# Patient Record
Sex: Male | Born: 1965
Health system: Southern US, Community
[De-identification: ages and names within clinical notes are randomized; demographics above are authoritative.]

## PROBLEM LIST (undated history)

## (undated) DIAGNOSIS — M47812 Spondylosis without myelopathy or radiculopathy, cervical region: Secondary | ICD-10-CM

## (undated) DIAGNOSIS — R0902 Hypoxemia: Secondary | ICD-10-CM

## (undated) DIAGNOSIS — N289 Disorder of kidney and ureter, unspecified: Secondary | ICD-10-CM

## (undated) DIAGNOSIS — I509 Heart failure, unspecified: Secondary | ICD-10-CM

## (undated) DIAGNOSIS — I38 Endocarditis, valve unspecified: Secondary | ICD-10-CM

## (undated) DIAGNOSIS — K2901 Acute gastritis with bleeding: Secondary | ICD-10-CM

## (undated) DIAGNOSIS — I10 Essential (primary) hypertension: Secondary | ICD-10-CM

## (undated) DIAGNOSIS — T7840XA Allergy, unspecified, initial encounter: Secondary | ICD-10-CM

## (undated) DIAGNOSIS — E781 Pure hyperglyceridemia: Secondary | ICD-10-CM

## (undated) DIAGNOSIS — K449 Diaphragmatic hernia without obstruction or gangrene: Secondary | ICD-10-CM

## (undated) DIAGNOSIS — G43009 Migraine without aura, not intractable, without status migrainosus: Secondary | ICD-10-CM

## (undated) DIAGNOSIS — F32A Depression, unspecified: Secondary | ICD-10-CM

## (undated) DIAGNOSIS — N189 Chronic kidney disease, unspecified: Secondary | ICD-10-CM

## (undated) DIAGNOSIS — M545 Low back pain, unspecified: Secondary | ICD-10-CM

## (undated) DIAGNOSIS — I4891 Unspecified atrial fibrillation: Secondary | ICD-10-CM

## (undated) DIAGNOSIS — F329 Major depressive disorder, single episode, unspecified: Secondary | ICD-10-CM

## (undated) DIAGNOSIS — G4730 Sleep apnea, unspecified: Secondary | ICD-10-CM

## (undated) DIAGNOSIS — M109 Gout, unspecified: Secondary | ICD-10-CM

## (undated) DIAGNOSIS — I517 Cardiomegaly: Secondary | ICD-10-CM

## (undated) DIAGNOSIS — N529 Male erectile dysfunction, unspecified: Secondary | ICD-10-CM

## (undated) HISTORY — DX: Migraine without aura, not intractable, without status migrainosus: G43.009

## (undated) HISTORY — PX: COLONOSCOPY: SHX174

## (undated) HISTORY — DX: Acute gastritis with bleeding: K29.01

## (undated) HISTORY — DX: Low back pain: M54.5

## (undated) HISTORY — DX: Hypoxemia: R09.02

## (undated) HISTORY — PX: UPPER GASTROINTESTINAL ENDOSCOPY: SHX188

## (undated) HISTORY — DX: Depression, unspecified: F32.A

## (undated) HISTORY — DX: Low back pain, unspecified: M54.50

## (undated) HISTORY — DX: Cardiomegaly: I51.7

## (undated) HISTORY — DX: Unspecified atrial fibrillation: I48.91

## (undated) HISTORY — DX: Endocarditis, valve unspecified: I38

## (undated) HISTORY — DX: Allergy, unspecified, initial encounter: T78.40XA

## (undated) HISTORY — DX: Essential (primary) hypertension: I10

## (undated) HISTORY — DX: Spondylosis without myelopathy or radiculopathy, cervical region: M47.812

## (undated) HISTORY — DX: Gout, unspecified: M10.9

## (undated) HISTORY — DX: Pure hyperglyceridemia: E78.1

## (undated) HISTORY — DX: Diaphragmatic hernia without obstruction or gangrene: K44.9

## (undated) HISTORY — DX: Sleep apnea, unspecified: G47.30

## (undated) HISTORY — DX: Male erectile dysfunction, unspecified: N52.9

## (undated) HISTORY — DX: Chronic kidney disease, unspecified: N18.9

## (undated) HISTORY — DX: Heart failure, unspecified: I50.9

## (undated) HISTORY — DX: Major depressive disorder, single episode, unspecified: F32.9

---

## 1992-08-03 HISTORY — PX: CARPAL TUNNEL RELEASE: SHX101

## 1997-08-03 HISTORY — PX: BACK SURGERY: SHX140

## 1997-12-21 ENCOUNTER — Ambulatory Visit (HOSPITAL_COMMUNITY): Admission: RE | Admit: 1997-12-21 | Discharge: 1997-12-21 | Payer: Self-pay | Admitting: Neurosurgery

## 1999-12-17 ENCOUNTER — Encounter: Payer: Self-pay | Admitting: *Deleted

## 1999-12-18 ENCOUNTER — Ambulatory Visit (HOSPITAL_BASED_OUTPATIENT_CLINIC_OR_DEPARTMENT_OTHER): Admission: RE | Admit: 1999-12-18 | Discharge: 1999-12-18 | Payer: Self-pay | Admitting: *Deleted

## 2001-08-03 HISTORY — PX: ROTATOR CUFF REPAIR: SHX139

## 2003-01-19 ENCOUNTER — Ambulatory Visit (HOSPITAL_BASED_OUTPATIENT_CLINIC_OR_DEPARTMENT_OTHER): Admission: RE | Admit: 2003-01-19 | Discharge: 2003-01-19 | Payer: Self-pay

## 2005-07-28 ENCOUNTER — Encounter: Admission: RE | Admit: 2005-07-28 | Discharge: 2005-07-28 | Payer: Self-pay | Admitting: Neurosurgery

## 2005-08-14 ENCOUNTER — Encounter: Admission: RE | Admit: 2005-08-14 | Discharge: 2005-08-14 | Payer: Self-pay | Admitting: Neurosurgery

## 2007-03-08 ENCOUNTER — Emergency Department: Payer: Self-pay | Admitting: Emergency Medicine

## 2007-03-08 ENCOUNTER — Other Ambulatory Visit: Payer: Self-pay

## 2009-05-06 ENCOUNTER — Observation Stay: Payer: Self-pay | Admitting: *Deleted

## 2009-05-09 ENCOUNTER — Emergency Department: Payer: Self-pay

## 2009-05-09 ENCOUNTER — Inpatient Hospital Stay: Payer: Self-pay | Admitting: Gastroenterology

## 2009-05-09 ENCOUNTER — Ambulatory Visit: Payer: Self-pay | Admitting: Gastroenterology

## 2009-05-28 ENCOUNTER — Ambulatory Visit: Payer: Self-pay | Admitting: Gastroenterology

## 2009-10-15 ENCOUNTER — Ambulatory Visit: Payer: Self-pay | Admitting: Physician Assistant

## 2010-08-03 HISTORY — PX: NASAL SINUS SURGERY: SHX719

## 2010-09-23 ENCOUNTER — Encounter: Payer: Self-pay | Admitting: Cardiovascular Disease

## 2010-09-23 ENCOUNTER — Emergency Department: Payer: Self-pay | Admitting: Emergency Medicine

## 2010-09-24 DIAGNOSIS — K2940 Chronic atrophic gastritis without bleeding: Secondary | ICD-10-CM | POA: Insufficient documentation

## 2010-09-24 DIAGNOSIS — R079 Chest pain, unspecified: Secondary | ICD-10-CM | POA: Insufficient documentation

## 2010-09-24 DIAGNOSIS — M545 Low back pain: Secondary | ICD-10-CM | POA: Insufficient documentation

## 2010-09-24 DIAGNOSIS — R0602 Shortness of breath: Secondary | ICD-10-CM | POA: Insufficient documentation

## 2010-09-24 DIAGNOSIS — N529 Male erectile dysfunction, unspecified: Secondary | ICD-10-CM | POA: Insufficient documentation

## 2010-09-24 DIAGNOSIS — R0600 Dyspnea, unspecified: Secondary | ICD-10-CM | POA: Insufficient documentation

## 2010-09-25 ENCOUNTER — Other Ambulatory Visit: Payer: Self-pay | Admitting: Cardiovascular Disease

## 2010-09-25 ENCOUNTER — Encounter: Payer: Self-pay | Admitting: Cardiology

## 2010-09-25 ENCOUNTER — Encounter: Payer: Self-pay | Admitting: Cardiovascular Disease

## 2010-09-25 ENCOUNTER — Ambulatory Visit (INDEPENDENT_AMBULATORY_CARE_PROVIDER_SITE_OTHER): Payer: BC Managed Care – PPO | Admitting: Cardiovascular Disease

## 2010-09-25 DIAGNOSIS — M47812 Spondylosis without myelopathy or radiculopathy, cervical region: Secondary | ICD-10-CM | POA: Insufficient documentation

## 2010-09-25 DIAGNOSIS — I10 Essential (primary) hypertension: Secondary | ICD-10-CM

## 2010-09-25 DIAGNOSIS — R072 Precordial pain: Secondary | ICD-10-CM

## 2010-09-25 DIAGNOSIS — R000 Tachycardia, unspecified: Secondary | ICD-10-CM

## 2010-09-25 DIAGNOSIS — R079 Chest pain, unspecified: Secondary | ICD-10-CM

## 2010-09-25 DIAGNOSIS — F329 Major depressive disorder, single episode, unspecified: Secondary | ICD-10-CM | POA: Insufficient documentation

## 2010-09-25 DIAGNOSIS — R0602 Shortness of breath: Secondary | ICD-10-CM

## 2010-09-25 DIAGNOSIS — J309 Allergic rhinitis, unspecified: Secondary | ICD-10-CM | POA: Insufficient documentation

## 2010-09-25 DIAGNOSIS — E781 Pure hyperglyceridemia: Secondary | ICD-10-CM | POA: Insufficient documentation

## 2010-09-25 LAB — BRAIN NATRIURETIC PEPTIDE: Pro B Natriuretic peptide (BNP): 6.8 pg/mL (ref 0.0–100.0)

## 2010-09-25 LAB — SEDIMENTATION RATE: Sed Rate: 9 mm/hr (ref 0–22)

## 2010-09-25 LAB — TSH: TSH: 1.02 u[IU]/mL (ref 0.35–5.50)

## 2010-09-25 LAB — T4, FREE: Free T4: 0.97 ng/dL (ref 0.60–1.60)

## 2010-09-26 ENCOUNTER — Encounter (INDEPENDENT_AMBULATORY_CARE_PROVIDER_SITE_OTHER): Payer: Self-pay | Admitting: *Deleted

## 2010-09-30 NOTE — Letter (Signed)
Summary: [facility]   Imported By: Marylou Mccoy 09/25/2010 08:45:59  _____________________________________________________________________  External Attachment:    Type:   Image     Comment:   External Document

## 2010-09-30 NOTE — Assessment & Plan Note (Signed)
Summary: np6/chest pain,sob/per pt wife call/rec will be fax or brough...   CC:  pt complains of chest pain and tingling in arms and fingers...also pt complains of sob and dizzness aswell.  History of Present Illness: 45 yo referred from [facility] ER and Dr Anna Genre.  Last few months lots of fatigue and dyspnea.  Worse the last month.  Sharp nonexertional pains in chest with left arm weakness last few weeks.  ER w/u negative.  Don't have lab or CXR reports but paitent indicates normal.  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  Sister sees Dr Marguarite Arbour for arrythmia and brother had CABG in his 40S with historhy of "hole in his heart".  Denies PND or othopnea.  No edema, palpitaotns or edema.  No previous cardiac problem or w/u.  Non smoker.  Sedentary.  Denies fever, chills.  Has had some pain on inside of right thigh that sounds muscular.  Also some restless legs at night but no claudication.  Symptoms seem progressive  Current Problems (verified): 1)  Unspecified Tachycardia  (ICD-785.0) 2)  Shortness of Breath  (ICD-786.05) 3)  Chest Pain  (ICD-786.50) 4)  Hypertriglyceridemia  (ICD-272.1) 5)  Allergic Rhinitis Cause Unspecified  (ICD-477.9) 6)  Depression  (ICD-311) 7)  Spondylosis, Cervical  (ICD-721.0) 8)  Impotence of Organic Origin  (ICD-607.84) 9)  Hypertension  (ICD-401.9) 10)  Low Back Pain, Acute  (ICD-724.2) 11)  Gastritis, Chronic  (ICD-535.10)  Current Medications (verified): 1)  Lisinopril-Hydrochlorothiazide 10-12.5 Mg Tabs (Lisinopril-Hydrochlorothiazide) .Marland Kitchen.. 1 Tab By Mouth Once Daily 2)  Aspirin 81 Mg Tbec (Aspirin) .... Take One Tablet By Mouth Daily  Allergies (verified): 1)  ! Codeine 2)  ! * Sulfanomides  Family History: Positive for cardiovascular disease. brother CABG in 56's  Prostate cancer : Paternal Uncle in his 9's  Social History:  He does not smoke but drinks occasionally.  He is married and works in [employer]. 3 older kids Likes to hunt and fish  Review of Systems       Denies fever,  weight loss, blurry vision, decreased visual acuity, cough, sputum, hemoptysis, pleuritic pain, palpitaitons, heartburn, abdominal pain, melena, lower extremity edema, claudication, or rash.   Vital Signs:  Patient profile:   45 year old male Height:      69 inches Weight:      214 pounds BMI:     31.72 Pulse rate:   90 / minute Resp:     14 per minute BP sitting:   118 / 80  (left arm)  Vitals Entered By: Kem Parkinson (September 25, 2010 1:56 PM)  Physical Exam  General:  Affect appropriate Healthy:  appears stated age HEENT: normal Neck supple with no adenopathy JVP normal no bruits no thyromegaly Lungs clear with no wheezing and good diaphragmatic motion Heart:  S1/S2 no murmur,rub, gallop or click PMI normal Abdomen: benighn, BS positve, no tenderness, no AAA no bruit.  No HSM or HJR Distal pulses intact with no bruits No edema Neuro non-focal Skin warm and dry    Impression & Recommendations:  Problem # 1:  UNSPECIFIED TACHYCARDIA (ICD-785.0) No evidence of CHF  check TSH and labs from [place].  Ecjho to assess EF Orders: TLB-TSH (Thyroid Stimulating Hormone) (84443-TSH) TLB-T4 (Thyrox), Free [phone])  Problem # 2:  SHORTNESS OF BREATH (ICD-786.05) Apparantly normal CXR in ER and normal exam.  Echo for RV/LV function His updated medication list for this problem includes:  Lisinopril-hydrochlorothiazide 10-12.5 Mg Tabs (Lisinopril-hydrochlorothiazide) .Marland Kitchen... 1 tab by mouth once daily    Aspirin 81 Mg Tbec (Aspirin) .Marland Kitchen... Take one tablet by mouth daily  Orders: Echocardiogram (Echo) TLB-BNP (B-Natriuretic Peptide) (83880-BNPR)  Problem # 3:  CHEST PAIN (ICD-786.50) Poor R wave progression on ECG.  Stress myovue with oxymetry His updated medication list for this problem includes:    Lisinopril-hydrochlorothiazide 10-12.5 Mg Tabs (Lisinopril-hydrochlorothiazide)  .Marland Kitchen... 1 tab by mouth once daily    Aspirin 81 Mg Tbec (Aspirin) .Marland Kitchen... Take one tablet by mouth daily  Orders: Nuclear Stress Test (Nuc Stress Test) TLB-Sedimentation Rate (ESR) (85652-ESR)  Problem # 4:  HYPERTRIGLYCERIDEMIA (ICD-272.1) Diet Rx.  Low carb  Problem # 5:  ESSENTIAL HYPERTENSION, BENIGN (ICD-401.1) Continue ACE/diuretic.  Seems well controlled.  See what BP response to exercise is His updated medication list for this problem includes:    Lisinopril-hydrochlorothiazide 10-12.5 Mg Tabs (Lisinopril-hydrochlorothiazide) .Marland Kitchen... 1 tab by mouth once daily    Aspirin 81 Mg Tbec (Aspirin) .Marland Kitchen... Take one tablet by mouth daily  Patient Instructions: 1)  Your physician recommends that you schedule a follow-up appointment in: [facility] AS TESTING 2)  Your physician has requested that you have an echocardiogram.  Echocardiography is a painless test that uses sound waves to create images of your heart. It provides your doctor with information about the size and shape of your heart and how well your heart's chambers and valves are working.  This procedure takes approximately one hour. There are no restrictions for this procedure. 3)  Your physician has requested that you have an exercise stress myoview.  For further information please visit [email or web address]  Please follow instruction sheet, as given.

## 2010-09-30 NOTE — Letter (Signed)
Summary: Generic Letter  Architectural technologist, Main Office  [address]   [place], [place] [address]   Phone: [phone]  Fax: [phone]        September 26, 2010 MRN: [id number]    Randy Schneider [address] [place], [place]  [address]    Dear [name],       We do not have your correct phone number on file. I wanted to let you know the blood work we checked was normal. Your thyroid function is normal. Please contact us with any questions or concern and with your correct phone number.    Sincerely,  Deliah Goody, RN/Dr Charlton Haws

## 2010-10-08 ENCOUNTER — Telehealth (INDEPENDENT_AMBULATORY_CARE_PROVIDER_SITE_OTHER): Payer: Self-pay | Admitting: *Deleted

## 2010-10-09 ENCOUNTER — Encounter: Payer: Self-pay | Admitting: Cardiovascular Disease

## 2010-10-09 ENCOUNTER — Encounter: Payer: Self-pay | Admitting: Cardiology

## 2010-10-09 ENCOUNTER — Ambulatory Visit (HOSPITAL_COMMUNITY): Payer: BC Managed Care – PPO | Attending: Cardiology

## 2010-10-09 ENCOUNTER — Ambulatory Visit (INDEPENDENT_AMBULATORY_CARE_PROVIDER_SITE_OTHER): Payer: BC Managed Care – PPO | Admitting: Cardiovascular Disease

## 2010-10-09 DIAGNOSIS — I059 Rheumatic mitral valve disease, unspecified: Secondary | ICD-10-CM | POA: Insufficient documentation

## 2010-10-09 DIAGNOSIS — I10 Essential (primary) hypertension: Secondary | ICD-10-CM | POA: Insufficient documentation

## 2010-10-09 DIAGNOSIS — R0602 Shortness of breath: Secondary | ICD-10-CM

## 2010-10-09 DIAGNOSIS — R072 Precordial pain: Secondary | ICD-10-CM

## 2010-10-09 DIAGNOSIS — R0609 Other forms of dyspnea: Secondary | ICD-10-CM

## 2010-10-09 DIAGNOSIS — E785 Hyperlipidemia, unspecified: Secondary | ICD-10-CM | POA: Insufficient documentation

## 2010-10-09 DIAGNOSIS — R079 Chest pain, unspecified: Secondary | ICD-10-CM | POA: Insufficient documentation

## 2010-10-09 DIAGNOSIS — R0989 Other specified symptoms and signs involving the circulatory and respiratory systems: Secondary | ICD-10-CM | POA: Insufficient documentation

## 2010-10-14 NOTE — Assessment & Plan Note (Addendum)
Summary: Cardiology Nuclear Testing  Nuclear Med Background Indications for Stress Test: Evaluation for Ischemia   History: Echo, GXT  History Comments:  ~2 yrs ago GXT:OK per patient; 10/09/10 Echo:EF=50-55%, mild MR  Symptoms: Chest Pain, Chest Pain with Exertion, Diaphoresis, Dizziness, DOE, Fatigue, Nausea, Near Syncope, Palpitations, Rapid HR  Symptoms Comments: CP with (L) arm numbness. Last episode of EA:[id number].   Nuclear Pre-Procedure Cardiac Risk Factors: Family History - CAD, Hypertension, Lipids, Obesity Caffeine/Decaff Intake: none NPO After: 6:00 PM Lungs: Clear.  O2 Sat 98% on RA. IV 0.9% NS with Angio Cath: 18g     IV Site: R Antecubital IV Started by: Stanton Kidney, EMT-P Chest Size (in) 44     Height (in): 69 Weight (lb): 214 BMI: 31.72 Tech Comments: Patient on lisinopril-HCTZ, he states he only takes it as needed.  Nuclear Med Study 1 or 2 day study:  1 day     Stress Test Type:  Stress Reading MD:  Willa Rough, MD     Referring MD:  Charlton Haws, MD Resting Radionuclide:  Technetium 25m Tetrofosmin     Resting Radionuclide Dose:  11 mCi  Stress Radionuclide:  Technetium 71m Tetrofosmin     Stress Radionuclide Dose:  33 mCi   Stress Protocol Exercise Time (min):  9:00 min     Max HR:  151 bpm     Predicted Max HR:  175 bpm  Max Systolic BP: 141 mm Hg     Percent Max HR:  86.29 %     METS: 10.4 Rate Pressure Product:  47829    Stress Test Technologist:  Rea College, CMA-N     Nuclear Technologist:  Domenic Polite, CNMT  Rest Procedure  Myocardial perfusion imaging was performed at rest 45 minutes following the intravenous administration of Technetium 44m Tetrofosmin.  Stress Procedure  The patient exercised for nine minutes on the treadmill utilizing the Bruce protocol.  The patient stopped due to fatigue.  He did c/o chest pressure, 9/10.  There were no significant ST-T wave changes; but he did have a hypotensive response to exercise, 141/69 to  110/43 at peak exercise.  O2 sats remained 98-99% with exercise.  Technetium 52m Tetrofosmin was injected at peak exercise and myocardial perfusion imaging was performed after a brief delay.  QPS Raw Data Images:  Normal; no motion artifact; normal heart/lung ratio. Stress Images:  Normal homogeneous uptake in all areas of the myocardium. Rest Images:  Normal homogeneous uptake in all areas of the myocardium. Subtraction (SDS):  No evidence of ischemia. Transient Ischemic Dilatation:  0.93  (Normal <1.22)  Lung/Heart Ratio:  0.30  (Normal <0.45)  Quantitative Gated Spect Images QGS EDV:  103 ml QGS ESV:  51 ml QGS EF:  50 % QGS cine images:  Normal motion  Findings Abnormal      Overall Impression  Exercise Capacity: Good exercise capacity. BP Response: Hypotensive response to stress Clinical Symptoms: chest pressure ECG Impression: No significant ST segment change suggestive of ischemia. Overall Impression Comments: With stress the patient had a hypotensive response. There was chest pain. There was no EKG change. There was no ectopy. The O2sat remained 98%. The nuclear images are normal. There is no scar or ischemia. BP normalized spontaneously. The etiology of the decreased BP is not clear.

## 2010-10-14 NOTE — Progress Notes (Signed)
Summary: Nuclear Pre-Procedure  Phone Note Outgoing Call   Call placed by: Milana Na, EMT-P,  October 08, 2010 10:56 AM Summary of Call: Reviewed information on Myoview Information Sheet (see scanned document for further details).  Spoke with patient.      Nuclear Med Background Indications for Stress Test: Evaluation for Ischemia     Symptoms: Chest Pain, Dizziness, DOE, Fatigue, SOB    Nuclear Pre-Procedure Cardiac Risk Factors: Family History - CAD, Hypertension, Lipids Height (in): 69  Nuclear Med Study Referring MD:  P.Sara Lee

## 2010-10-14 NOTE — Assessment & Plan Note (Signed)
Summary: per check out/ ok per debra/having echo/stress @ 7:30/saf   History of Present Illness: 45 yo referred from [facility] ER and Dr Anna Genre.  Last few months lots of fatigue and dyspnea.  Worse the last month.  Sharp nonexertional pains in chest with left arm weakness last few weeks.  ER w/u negative.  Labs and CXR normal.  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  Sister sees Dr Marguarite Arbour for arrythmia and brother had CABG in his 40S with historhy of "hole in his heart".  Denies PND or othopnea.  No edema, palpitaotns or edema.  No previous cardiac problem or w/u.  Non smoker.  Sedentary.  Denies fever, chills.  Has had some pain on inside of right thigh that sounds muscular.  Also some restless legs at night but no claudication.  Symptoms seem progressive  Review Myovue:  Exerc 9 min chest pain, fatigue dizzy but normal ECG, normal images and normal hemodynamic response Review Echo:  EF 50-55% mild MR  Had SSCP, fatigue and dizzyness with ETT.  Discussed normal findings with patient and wife.  Dont think further w/u indicated at this time.  Patients affect still seems depressed.  Will reevaluate in 4-6 weeks.  Current Problems (verified): 1)  Essential Hypertension, Benign  (ICD-401.1) 2)  Unspecified Tachycardia  (ICD-785.0) 3)  Shortness of Breath  (ICD-786.05) 4)  Chest Pain  (ICD-786.50) 5)  Hypertriglyceridemia  (ICD-272.1) 6)  Allergic Rhinitis Cause Unspecified  (ICD-477.9) 7)  Depression  (ICD-311) 8)  Spondylosis, Cervical  (ICD-721.0) 9)  Impotence of Organic Origin  (ICD-607.84) 10)  Hypertension  (ICD-401.9) 11)  Low Back Pain, Acute  (ICD-724.2) 12)  Gastritis, Chronic  (ICD-535.10)  Current Medications (verified): 1)  Lisinopril-Hydrochlorothiazide 10-12.5 Mg Tabs (Lisinopril-Hydrochlorothiazide) .Marland Kitchen.. 1 Tab By Mouth Once Daily 2)  Aspirin 81 Mg Tbec (Aspirin) .... Take One Tablet By Mouth Daily  Allergies: 1)  ! Codeine 2)  ! *  Sulfanomides  Past History:  Past Medical History: Last updated: 09/24/2010 SHORTNESS OF BREATH CHEST PAIN  HYPERTRIGLYCERIDEMIA  ALLERGIC RHINITIS CAUSE UNSPECIFIED DEPRESSION SPONDYLOSIS, CERVICAL IMPOTENCE OF ORGANIC ORIGIN HYPERTENSION LOW BACK PAIN, ACUTE  GASTRITIS, CHRONIC  Past Surgical History: Last updated: 09/24/2010 lumbar diskectomy   Arthrotomy with incision and drainage of metacarpophalangeal joint right fifth finger.  Family History: Last updated: 09/25/2010 Positive for cardiovascular disease. brother CABG in 40's  Prostate cancer : Paternal Uncle in his 39's  Social History: Last updated: 09/25/2010  He does not smoke but drinks occasionally.  He is married and works in [employer]. 3 older kids Likes to hunt and fish  Vital Signs:  Patient profile:   45 year old male Pulse rate:   72 / minute Resp:     12 per minute BP supine:   130 / 70  Physical Exam  General:  Affect appropriate Healthy:  appears stated age HEENT: normal Neck supple with no adenopathy JVP normal no bruits no thyromegaly Lungs clear with no wheezing and good diaphragmatic motion Heart:  S1/S2 no murmur,rub, gallop or click PMI normal Abdomen: benighn, BS positve, no tenderness, no AAA no bruit.  No HSM or HJR Distal pulses intact with no bruits No edema Neuro non-focal Skin warm and dry    Impression & Recommendations:  Problem # 1:  ESSENTIAL HYPERTENSION, BENIGN (ICD-401.1) Well controlled His updated medication list for this problem includes:    Lisinopril-hydrochlorothiazide 10-12.5 Mg Tabs (Lisinopril-hydrochlorothiazide) .Marland Kitchen... 1 tab by mouth once  daily    Aspirin 81 Mg Tbec (Aspirin) .Marland Kitchen... Take one tablet by mouth daily  Problem # 2:  SHORTNESS OF BREATH (ICD-786.05) No obvious cardiopulmonary abnormality Consider right and left cath in future  His updated medication list for this problem includes:     Lisinopril-hydrochlorothiazide 10-12.5 Mg Tabs (Lisinopril-hydrochlorothiazide) .Marland Kitchen... 1 tab by mouth once daily    Aspirin 81 Mg Tbec (Aspirin) .Marland Kitchen... Take one tablet by mouth daily  Problem # 3:  CHEST PAIN (ICD-786.50) Normal myovue  Observe  Consider cath or cardiac CT if symptoms persist His updated medication list for this problem includes:    Lisinopril-hydrochlorothiazide 10-12.5 Mg Tabs (Lisinopril-hydrochlorothiazide) .Marland Kitchen... 1 tab by mouth once daily    Aspirin 81 Mg Tbec (Aspirin) .Marland Kitchen... Take one tablet by mouth daily  Problem # 4:  DEPRESSION (ICD-311) F/U primary May have a lot to do with his symptoms  Patient Instructions: 1)  Your physician recommends that you schedule a follow-up appointment in:

## 2010-11-25 ENCOUNTER — Ambulatory Visit: Payer: Self-pay | Admitting: Physician Assistant

## 2010-11-25 ENCOUNTER — Encounter: Payer: Self-pay | Admitting: Internal Medicine

## 2010-11-28 ENCOUNTER — Ambulatory Visit: Payer: BC Managed Care – PPO | Admitting: Cardiovascular Disease

## 2011-06-12 ENCOUNTER — Ambulatory Visit: Payer: Self-pay | Admitting: Otolaryngology

## 2011-07-13 ENCOUNTER — Ambulatory Visit: Payer: Self-pay | Admitting: Anesthesiology

## 2011-07-16 ENCOUNTER — Ambulatory Visit: Payer: Self-pay | Admitting: Otolaryngology

## 2011-07-20 LAB — PATHOLOGY REPORT

## 2013-08-28 ENCOUNTER — Encounter: Payer: Self-pay | Admitting: General Surgery

## 2013-08-28 ENCOUNTER — Ambulatory Visit (INDEPENDENT_AMBULATORY_CARE_PROVIDER_SITE_OTHER): Payer: BC Managed Care – PPO | Admitting: General Surgery

## 2013-08-28 VITALS — BP 122/80 | HR 74 | Resp 16 | Ht 69.0 in | Wt 238.0 lb

## 2013-08-28 DIAGNOSIS — R0989 Other specified symptoms and signs involving the circulatory and respiratory systems: Secondary | ICD-10-CM

## 2013-08-28 DIAGNOSIS — R0600 Dyspnea, unspecified: Secondary | ICD-10-CM

## 2013-08-28 DIAGNOSIS — R0609 Other forms of dyspnea: Secondary | ICD-10-CM

## 2013-08-28 DIAGNOSIS — K429 Umbilical hernia without obstruction or gangrene: Secondary | ICD-10-CM

## 2013-08-28 DIAGNOSIS — M6208 Separation of muscle (nontraumatic), other site: Secondary | ICD-10-CM | POA: Insufficient documentation

## 2013-08-28 DIAGNOSIS — M6200 Separation of muscle (nontraumatic), unspecified site: Secondary | ICD-10-CM

## 2013-08-28 NOTE — Patient Instructions (Signed)
Patient has been scheduled for an appointment with Dr. Mariah MillingGollan for 09-05-13 at 10:15 am (arrive 10 am). He is aware of date, time, and instructions.

## 2013-08-28 NOTE — Progress Notes (Signed)
Patient ID: Randy Schneider, male   DOB: [birth year], 48 y.o.   MRN: [id number]  Chief Complaint  Patient presents with  . Other    New Pt evaluation of umbilical hernia    HPI Randy Schneider is a 48 y.o. male here today for an evaluation of an umbilical hernia. Patient states it has been there for about 2 months. He states he has pain in this area that comes and goes. He states lifting aggravates the area. The pain is described as a soreness. He also states he has a burning sensation whenever he lifts something.   The patient is accompanied today by his wife who was present for the interview and exam.  He reports that for about a year he is aware of a diffuse bulge between the xiphoid process and the umbilicus what is changing position. No similar bulges notable when he is standing.  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 He has had no episodes to suggest incarceration. No vomiting or nausea.  The patient reports that he has had progressive shortness of breath, and is winded after one flight of stairs. He was evaluated by an Arnoldo HookerBruce Kowalski, M.D. From [facility] a year ago. He was placed on CPAP at that time As well as home oxygen. When he went back for a followup visit there was some confusion as to why he was on oxygen and the patient felt uncomfortable returning. He has not experienced a significant progression of his dyspnea on exertion, but is certainly not been able to exercise as evident by a 10-15 pound weight gain over the past year.    HPI  Past Medical History  Diagnosis Date  . Hypertension   . CHF (congestive heart failure)   . Sleep apnea     Past Surgical History  Procedure Laterality Date  . Carpal tunnel release Right 1994  . Back surgery  1999  . Rotator cuff repair Left 2003  . Nasal sinus surgery  2012    History reviewed. No pertinent  family history.  Social History History  Substance Use Topics  . Smoking status: Never Smoker   . Smokeless tobacco: Not on file  . Alcohol Use: No    Allergies  Allergen Reactions  . Codeine Other (See Comments)    Nervous   . Sulfa Antibiotics Rash    Current Outpatient Prescriptions  Medication Sig Dispense Refill  . aspirin 81 MG tablet Take 81 mg by mouth daily.      Marland Kitchen. lisinopril-hydrochlorothiazide (PRINZIDE,ZESTORETIC) 10-12.5 MG per tablet Take 1 tablet by mouth daily.       No current facility-administered medications for this visit.    Review of Systems Review of Systems  Constitutional: Negative.   Respiratory: Negative.   Cardiovascular: Negative.   Gastrointestinal: Positive for abdominal pain.    Blood pressure 122/80, pulse 74, resp. rate 16, height 5\' 9"  (1.753 m), weight 238 lb (107.956 kg).  Physical Exam Physical Exam  Constitutional: He is oriented to person, place, and time. He appears well-developed and well-nourished.  HENT:  Head: Normocephalic.  Eyes: Pupils are equal, round, and reactive to light.  Neck: Normal range of motion. Neck supple. No tracheal deviation present. No thyromegaly present.  Cardiovascular: Normal rate and regular rhythm.   Pulmonary/Chest: Effort normal and breath sounds normal.  Abdominal: Soft. Bowel sounds are normal.  A small, less than 5 mm fascial defect is appreciated at the umbilicus with local tenderness on compression.  Lymphadenopathy:    He has no cervical adenopathy.  Neurological: He is alert and oriented to person, place, and time. He has normal reflexes.  Skin: Skin is warm and dry.    Data Reviewed Notes from Lonie Peak, PA C.Dated August 08, 2013 were reviewed. Nitroglycerin was listed as an active medication. Record reports a normal stress test in October 2010 at [facility] and at [place] in March 2012.  Assessment    Diastases recti, very small umbilical hernia. Lifestyle limiting dyspnea.     Plan    The patient was encouraged to get a second opinion regarding his prior cardiology evaluation. At this time would not recommend Surgical intervention for the diastases recti, as this is usually self-limiting in progression. The umbilical hernias very small, and the discomfort he experiences with strenuous activity is unlikely to rapidly progressed. Once his cardiac situation is cleared and his dyspnea has resolved, he is encouraged to return if he is still symptomatic.     Patient has been scheduled for an appointment with Dr. Mariah Milling for 09-05-13 at 10:15 am (arrive 10 am). He is aware of date, time, and instructions.   Earline Mayotte 08/28/2013, 7:52 PM

## 2013-09-05 ENCOUNTER — Ambulatory Visit: Payer: BC Managed Care – PPO | Admitting: Cardiovascular Disease

## 2013-09-15 ENCOUNTER — Encounter: Payer: Self-pay | Admitting: Cardiovascular Disease

## 2013-09-15 ENCOUNTER — Ambulatory Visit (INDEPENDENT_AMBULATORY_CARE_PROVIDER_SITE_OTHER): Payer: BC Managed Care – PPO | Admitting: Cardiovascular Disease

## 2013-09-15 VITALS — BP 110/82 | HR 77 | Ht 69.0 in | Wt 233.0 lb

## 2013-09-15 DIAGNOSIS — I10 Essential (primary) hypertension: Secondary | ICD-10-CM

## 2013-09-15 DIAGNOSIS — R079 Chest pain, unspecified: Secondary | ICD-10-CM

## 2013-09-15 DIAGNOSIS — R0602 Shortness of breath: Secondary | ICD-10-CM

## 2013-09-15 DIAGNOSIS — Z01818 Encounter for other preprocedural examination: Secondary | ICD-10-CM

## 2013-09-15 DIAGNOSIS — R002 Palpitations: Secondary | ICD-10-CM

## 2013-09-15 NOTE — Assessment & Plan Note (Signed)
Blood pressure is well controlled on today's visit. No changes made to the medications. 

## 2013-09-15 NOTE — Assessment & Plan Note (Signed)
Chronic chest pain symptoms. Prior negative stress test. He is not satisfied given his continued symptoms. We have discussed the various options with him and the only other testing available is cardiac catheterization with left heart cath, right heart cath. This will exclude underlying coronary artery disease and ischemia. He does have EKG changes with possible findings concerning for inferior wall MI. There is some mention of dilated right atrium and right ventricle by the patient. Echocardiogram from an outside facility not available ([facility] . Right heart catheterization can't exclude pulmonary hypertension as a cause of his shortness of breath.

## 2013-09-15 NOTE — Assessment & Plan Note (Addendum)
Records reviewed including multiple stress tests, [facility] .Etiology of his shortness of breath is unclear. No clinical signs of heart failure. No prior smoking history. Lung exam essentially normal. We'll schedule for right heart catheterization to exclude pulmonary hypertension. If this is normal, may need referral to pulmonary.

## 2013-09-15 NOTE — Progress Notes (Signed)
Patient ID: Randy LimRoger D Schneider, male    DOB: [birth year], 48 y.o.   MRN: [id number]  HPI Comments: Mr. Randy QuintLayton is a 48 year old gentleman with a history of chronic chest pain, chronic neck pain , depression, obstructive sleep apnea who wear CPAP, migraines, shortness of breath starting back when 5 years ago who presents to establish care in our office. He is seen at [facility].  He reports that symptoms have been getting worse over the past 5 years or so. He has shortness of breath with exertion, chest pain with exertion, periods of left arm pain, numbness in his hand.  Records indicate stress testing in October 2010 showing no ischemia. This was a Animatornuclear Myoview. He had repeat nuclear [place]ov[place] in [place] again showing no ischemia. Images were also reviewed by myself with the patient. They appeared normal. Repeat echo stress test at outside office may 2013 that showed no evidence of ischemia. Otherwise echocardiogram is normal  Echocardiogram October 2010 showing essentially normal echocardiogram with normal ejection fraction. No mention of dilated right ventricle right atrium  Reports that he was told by outside cardiologist that his right atrium and right ventricle were dilated, and that he needed oxygen. He reports that he has oxygen at home.  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 aspirin 81 MG tablet Take 81 mg by mouth daily.  Marland Kitchen. lisinopril-hydrochlorothiazide (PRINZIDE,ZESTORETIC) 10-12.5 MG per tablet Take 1 tablet by mouth daily.  . NON FORMULARY CPAP DAILY  . NON FORMULARY Oxygen 3 Liters    Review of Systems  Constitutional: Negative.   HENT: Negative.   Eyes: Negative.   Respiratory: Positive for chest tightness and shortness of breath.   Cardiovascular: Positive for chest pain.  Gastrointestinal:  Negative.   Endocrine: Negative.   Musculoskeletal: Negative.   Skin: Negative.   Allergic/Immunologic: Negative.   Neurological: Negative.   Hematological: Negative.   Psychiatric/Behavioral: Positive for dysphoric mood.  All other systems reviewed and are negative.    BP 110/82  Pulse 77  Ht 5\' 9"  (1.753 m)  Wt 233 lb (105.688 kg)  BMI 34.39 kg/m2  Physical Exam  Nursing note and vitals reviewed. Constitutional: He is oriented to person, place, and time. He appears well-developed and well-nourished.  HENT:  Head: Normocephalic.  Nose: Nose normal.  Mouth/Throat: Oropharynx is clear and moist.  Eyes: Conjunctivae are normal. Pupils are equal, round, and reactive to light.  Neck: Normal range of motion. Neck supple. No JVD present.  Cardiovascular: Normal rate, regular rhythm, S1 normal, S2 normal, normal heart sounds and intact distal pulses.  Exam reveals no gallop and no friction rub.   No murmur heard. Pulmonary/Chest: Effort normal and breath sounds normal. No respiratory distress. He has no wheezes. He has no rales. He exhibits no tenderness.  Abdominal: Soft. Bowel sounds are normal. He exhibits no distension. There is no tenderness.  Musculoskeletal: Normal range of motion. He exhibits no edema and no tenderness.  Lymphadenopathy:    He has no cervical adenopathy.  Neurological: He is alert and oriented to person, place, and time. Coordination normal.  Skin: Skin is warm and dry. No rash noted. No erythema.  Psychiatric: He has a normal mood and affect. His behavior is normal. Judgment and thought content normal.      Assessment and Plan

## 2013-09-15 NOTE — Patient Instructions (Addendum)
[facility] Cardiac Cath Instructions   You are scheduled for a Cardiac Cath on:____Thursday, March 5_______  Please arrive at __7:30am__am on the day of your procedure  You will need to pre-register prior to the day of your procedure.  [facility].  Registration is the first desk on your right.  Please take the procedure order we have given you in order to be registered appropriately  Do not eat/drink anything after midnight  Someone will need to drive you home  It is recommended someone be with you for the first 24 hours after your procedure  Wear clothes that are easy to get on/off and wear slip on shoes if possible   Medications bring a current list of all medications with you  _X_ Do not take these medications before your procedure:_______lisinopril-hctz________________________   Day of your procedure: Arrive at the Medical Mall entrance.  Free valet service is available.  After entering the Medical Mall please check-in at the registration desk (1st desk on your right) to receive your armband. After receiving your armband someone will escort you to the cardiac cath/special procedures waiting area.  The usual length of stay after your procedure is about 2 to 3 hours.  This can vary.  If you have any questions, please call our office at [phone], or you may call the cardiac cath lab at [facility] directly at [phone]  Come in between Feb 23 and March 4 for labs and pick up order for chest x-ray

## 2013-09-29 ENCOUNTER — Other Ambulatory Visit: Payer: Self-pay

## 2013-09-29 ENCOUNTER — Ambulatory Visit (INDEPENDENT_AMBULATORY_CARE_PROVIDER_SITE_OTHER): Payer: BC Managed Care – PPO | Admitting: *Deleted

## 2013-09-29 ENCOUNTER — Ambulatory Visit: Payer: Self-pay | Admitting: Cardiovascular Disease

## 2013-09-29 DIAGNOSIS — R079 Chest pain, unspecified: Secondary | ICD-10-CM

## 2013-09-29 DIAGNOSIS — Z01818 Encounter for other preprocedural examination: Secondary | ICD-10-CM

## 2013-09-29 DIAGNOSIS — R002 Palpitations: Secondary | ICD-10-CM

## 2013-09-29 DIAGNOSIS — R0602 Shortness of breath: Secondary | ICD-10-CM

## 2013-09-30 LAB — BASIC METABOLIC PANEL
BUN/Creatinine Ratio: 12 (ref 9–20)
BUN: 18 mg/dL (ref 6–24)
CALCIUM: 9.4 mg/dL (ref 8.7–10.2)
CO2: 24 mmol/L (ref 18–29)
Chloride: 99 mmol/L (ref 97–108)
Creatinine, Ser: 1.46 mg/dL — ABNORMAL HIGH (ref 0.76–1.27)
GFR calc Af Amer: 65 mL/min/{1.73_m2} (ref >59)
GFR calc non Af Amer: 56 mL/min/{1.73_m2} — ABNORMAL LOW (ref >59)
GLUCOSE: 105 mg/dL — AB (ref 65–99)
POTASSIUM: 4.1 mmol/L (ref 3.5–5.2)
Sodium: 141 mmol/L (ref 134–144)

## 2013-09-30 LAB — CBC WITH DIFFERENTIAL
BASOS: 0 %
Basophils Absolute: 0 10*3/uL (ref 0.0–0.2)
EOS ABS: 0.1 10*3/uL (ref 0.0–0.4)
EOS: 2 %
HCT: 39.2 % (ref 37.5–51.0)
HEMOGLOBIN: 13.8 g/dL (ref 12.6–17.7)
IMMATURE GRANS (ABS): 0 10*3/uL (ref 0.0–0.1)
Immature Granulocytes: 0 %
Lymphocytes Absolute: 1.9 10*3/uL (ref 0.7–3.1)
Lymphs: 34 %
MCH: 29.9 pg (ref 26.6–33.0)
MCHC: 35.2 g/dL (ref 31.5–35.7)
MCV: 85 fL (ref 79–97)
Monocytes Absolute: 0.4 10*3/uL (ref 0.1–0.9)
Monocytes: 8 %
NEUTROS ABS: 3.1 10*3/uL (ref 1.4–7.0)
NEUTROS PCT: 56 %
Platelets: 210 10*3/uL (ref 150–379)
RBC: 4.61 x10E6/uL (ref 4.14–5.80)
RDW: 13.5 % (ref 12.3–15.4)
WBC: 5.6 10*3/uL (ref 3.4–10.8)

## 2013-09-30 LAB — PROTIME-INR
INR: 1 (ref 0.8–1.2)
PROTHROMBIN TIME: 9.9 s (ref 9.1–12.0)

## 2013-10-05 ENCOUNTER — Encounter: Payer: Self-pay | Admitting: Cardiovascular Disease

## 2013-10-05 ENCOUNTER — Ambulatory Visit: Payer: Self-pay | Admitting: Cardiovascular Disease

## 2013-10-05 ENCOUNTER — Other Ambulatory Visit: Payer: Self-pay

## 2013-10-05 DIAGNOSIS — R079 Chest pain, unspecified: Secondary | ICD-10-CM

## 2013-10-05 DIAGNOSIS — R9431 Abnormal electrocardiogram [ECG] [EKG]: Secondary | ICD-10-CM

## 2013-10-05 DIAGNOSIS — I517 Cardiomegaly: Secondary | ICD-10-CM

## 2013-10-05 MED ORDER — METOPROLOL TARTRATE 50 MG PO TABS
50.0000 mg | ORAL_TABLET | Freq: Two times a day (BID) | ORAL | Status: DC
Start: 2013-10-05 — End: 2013-10-11

## 2013-10-09 ENCOUNTER — Telehealth: Payer: Self-pay

## 2013-10-09 NOTE — Telephone Encounter (Signed)
Spoke w/ pt.  He reports that he is feeling good and that he has only had a few episodes of his heart "jumping". Offered pt to come in for EKG or move appt up, but he prefers to keep appt w/ Dr. Mariah MillingGollan on Wed 3/11. Advised pt to call w/ any questions or concerns.

## 2013-10-11 ENCOUNTER — Ambulatory Visit (INDEPENDENT_AMBULATORY_CARE_PROVIDER_SITE_OTHER): Payer: BC Managed Care – PPO | Admitting: Cardiovascular Disease

## 2013-10-11 ENCOUNTER — Encounter: Payer: Self-pay | Admitting: Cardiovascular Disease

## 2013-10-11 VITALS — BP 120/80 | HR 67 | Ht 69.0 in | Wt 237.2 lb

## 2013-10-11 DIAGNOSIS — R079 Chest pain, unspecified: Secondary | ICD-10-CM

## 2013-10-11 DIAGNOSIS — R0602 Shortness of breath: Secondary | ICD-10-CM

## 2013-10-11 DIAGNOSIS — I4891 Unspecified atrial fibrillation: Secondary | ICD-10-CM | POA: Insufficient documentation

## 2013-10-11 DIAGNOSIS — I10 Essential (primary) hypertension: Secondary | ICD-10-CM

## 2013-10-11 MED ORDER — METOPROLOL TARTRATE 50 MG PO TABS: 50.0000 mg | ORAL_TABLET | Freq: Two times a day (BID) | ORAL | Status: DC

## 2013-10-11 MED ORDER — FLECAINIDE ACETATE 50 MG PO TABS: 50.0000 mg | ORAL_TABLET | Freq: Two times a day (BID) | ORAL | Status: DC

## 2013-10-11 MED ORDER — EDOXABAN TOSYLATE 60 MG PO TABS: 60.0000 mg | ORAL_TABLET | Freq: Every day | ORAL | Status: DC

## 2013-10-11 MED ORDER — FLECAINIDE ACETATE 50 MG PO TABS
50.0000 mg | ORAL_TABLET | Freq: Two times a day (BID) | ORAL | Status: DC
Start: 2013-10-11 — End: 2013-10-11

## 2013-10-11 MED ORDER — FUROSEMIDE 20 MG PO TABS: 20.0000 mg | ORAL_TABLET | ORAL | Status: DC | PRN

## 2013-10-11 NOTE — Assessment & Plan Note (Signed)
With normal cardiac catheterization showing no significant CAD, symptoms likely from atrial fibrillation that was previously undiagnosed. Now on metoprolol, will start flecainide and anticoagulation

## 2013-10-11 NOTE — Progress Notes (Signed)
Patient ID: Randy Schneider, male    DOB: 14-Mar-1966, 48 y.o.   MRN: [id number]  HPI Comments: Randy Schneider is a 48 year old gentleman with a history of chronic chest pain, chronic neck pain , depression, obstructive sleep apnea who wear CPAP, migraines, shortness of breath starting back when 5 years ago who presents for routine followup after cardiac catheterization last week. He is seen at [facility] medical [facility].   Prior symptoms of chest tightness, shortness of breath for which he had a cardiac catheterization. Cardiac catheterization on 10/05/2013 showed no significant CAD. Post procedure he developed a migraine, noted to have atrial fibrillation with heart rate 110 beats per minute up to 150 beats per minute. He was started on metoprolol tartrate 50 mg twice a day with followup today.  He reports that on metoprolol, he has had improved heart rates though occasional breakthrough arrhythmia.Marland Kitchen overall feels much better.  In hindsight, atrial fibrillation symptoms could date back several years Previous shortness of breath with exertion, chest pain with exertion, periods of left arm pain, numbness in his hand.  Occasionally had to go home from work secondary to symptoms Records indicate stress testing in October 2010 showing no ischemia. 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 He had repeat nuclear Myoview in 2012 in [place] again showing no ischemia. Images were also reviewed by myself with the patient. They appeared normal. Repeat echo stress test at outside office may 2013 that showed no evidence of ischemia. Otherwise echocardiogram is normal  Echocardiogram October 2010 showing essentially normal echocardiogram with normal ejection fraction. No mention of dilated right ventricle right atrium  Reports that he was told by outside cardiologist that his right atrium and right ventricle were dilated, and that he needed oxygen. He reports that he has oxygen at home.  EKG today shows normal sinus rhythm  with rate 65 beats per minute, no significant ST or T wave changes, consider old inferior MI   Outpatient Encounter Prescriptions as of 10/11/2013  Medication Sig  . aspirin 81 MG tablet Take 81 mg by mouth daily.  . metoprolol (LOPRESSOR) 50 MG tablet Take 1 tablet (50 mg total) by mouth 2 (two) times daily.  . NON FORMULARY CPAP DAILY  . NON FORMULARY Oxygen 3 Liters    Review of Systems  Constitutional: Negative.   HENT: Negative.   Eyes: Negative.   Cardiovascular: Positive for palpitations.  Gastrointestinal: Negative.   Endocrine: Negative.   Musculoskeletal: Negative.   Skin: Negative.   Allergic/Immunologic: Negative.   Neurological: Negative.   Hematological: Negative.   Psychiatric/Behavioral: Positive for dysphoric mood.  All other systems reviewed and are negative.    BP 120/80  Pulse 67  Ht 5\' 9"  (1.753 m)  Wt 237 lb 4 oz (107.616 kg)  BMI 35.02 kg/m2  Physical Exam  Nursing note and vitals reviewed. Constitutional: He is oriented to person, place, and time. He appears well-developed and well-nourished.  HENT:  Head: Normocephalic.  Nose: Nose normal.  Mouth/Throat: Oropharynx is clear and moist.  Eyes: Conjunctivae are normal. Pupils are equal, round, and reactive to light.  Neck: Normal range of motion. Neck supple. No JVD present.  Cardiovascular: Normal rate, regular rhythm, S1 normal, S2 normal, normal heart sounds and intact distal pulses.  Exam reveals no gallop and no friction rub.   No murmur heard. Pulmonary/Chest: Effort normal and breath sounds normal. No respiratory distress. He has no wheezes. He has no rales. He exhibits no tenderness.  Abdominal: Soft. Bowel sounds are normal.  He exhibits no distension. There is no tenderness.  Musculoskeletal: Normal range of motion. He exhibits no edema and no tenderness.  Lymphadenopathy:    He has no cervical adenopathy.  Neurological: He is alert and oriented to person, place, and time. Coordination  normal.  Skin: Skin is warm and dry. No rash noted. No erythema.  Psychiatric: He has a normal mood and affect. His behavior is normal. Judgment and thought content normal.      Assessment and Plan

## 2013-10-11 NOTE — Assessment & Plan Note (Signed)
Blood pressure is well controlled on today's visit. No changes made to the medications. 

## 2013-10-11 NOTE — Patient Instructions (Addendum)
Please start Savaysa 60mg  daily. Please start flecainide 50mg  twice a day, take an extra tablet as needed for breakthrough afib. Please start lasix 20mg  as needed for fullness and leg swelling.  Make sure to eat a banana when you take this.  Please call us if you have new issues that need to be addressed before your next appt.  Your physician wants you to follow-up in: 3 months.  You will receive a reminder letter in the mail two months in advance. If you don't receive a letter, please call our office to schedule the follow-up appointment.

## 2013-10-11 NOTE — Assessment & Plan Note (Signed)
Previous shortness of breath symptoms likely from atrial fibrillation

## 2013-10-11 NOTE — Assessment & Plan Note (Signed)
Maintaining normal sinus rhythm on today's visit. Continues to have breakthrough arrhythmia per the patient. We will start him on flecainide 50 mg twice a day, continue metoprolol 50 mg twice a day. We will also start him on anticoagulation, Savaysa 60 mg daily

## 2013-10-19 ENCOUNTER — Ambulatory Visit: Payer: BC Managed Care – PPO | Admitting: Cardiovascular Disease

## 2013-12-12 ENCOUNTER — Telehealth: Payer: Self-pay

## 2013-12-12 MED ORDER — AMLODIPINE BESYLATE 5 MG PO TABS
5.0000 mg | ORAL_TABLET | Freq: Every day | ORAL | Status: DC
Start: 2013-12-12 — End: 2013-12-13

## 2013-12-12 NOTE — Telephone Encounter (Signed)
Pt wife called, states BP is 148/97, has stayed at this reading for the last several days. Has questions regarding his metoprolol and Lasix. Please call.

## 2013-12-12 NOTE — Telephone Encounter (Signed)
Spoke w/ pt's wife.  Advised her of Dr. Windell HummingbirdGollan's recommendation.  She is agreeable, will have pt monitor BP and call w/ further questions or concerns.

## 2013-12-12 NOTE — Telephone Encounter (Signed)
Spoke w/ pt's wife.  She reports that pt's BP has been up for the past 4-5 days, staying around 148/97. Pt has been c/o headaches daily. Reports pt is staying hydrated, drinks water all day at work. Admits that pt does eat a lot of ham sandwiches, but recently switched to [place]. Discussed w/ her the need to avoid processed foods.  She reports that pt is expecting a 4th grandchild soon and had some recent changes at work. She states that she tries to keep stress away from him by not telling him about bad things that happen throughout the day, "I don't tell him if I bang something w/ the car of if the grandkids got in a fight". Pt is going out of town this weekend for a fishing trip and she is hoping this will relieve some stress. Pt currently taking metoprolol 50mg  and lasix prn. Reports that metoprolol makes him so tired that he often falls asleep on the couch around 6:00 every evening, no matter what is going on in the house.  Pt hoped that his old med would help w/ his HA, so he took a 1/2 of his lisinopril 10-12.5mg  that he had in the house, but it did not bring his BP down. Advised wife to adjust pt's diet and monitor pt's BP. She would like to know if pt's meds can be adjusted, as he does not feel good.  Pt did not want her to call, as he is concerned that his metoprolol will be increased.  Please advise.  Thank you.

## 2013-12-12 NOTE — Telephone Encounter (Signed)
We could add amlodipine 5 mg daily for high blood pressure Continue metoprolol 50 mg twice a day, blood thinner, flecainide twice a day for atrial fibrillation

## 2013-12-12 NOTE — Telephone Encounter (Signed)
Spoke w/ pt's wife.  She reports that pt picked up rx for amlodipine that was sent in today. After he got it home, he remembered that he had taken this med in 2013 and experienced swelling and cramping in his ankles. He would like to know what alternative can be sent in for him.

## 2013-12-13 MED ORDER — LISINOPRIL-HYDROCHLOROTHIAZIDE 10-12.5 MG PO TABS: 1.0000 | ORAL_TABLET | Freq: Every day | ORAL | Status: DC

## 2013-12-13 NOTE — Telephone Encounter (Signed)
Would start lisinopril HCTZ 10/12.5 daily and monitor your blood pressure Takes 5 days to begin   Or could try low-dose amlodipine, one half pill daily  titrating up to a whole pill if needed

## 2013-12-13 NOTE — Telephone Encounter (Signed)
Spoke w/ pt's wife.  She reports that pt's previous dose of amlodipine was 5mg  and this caused the swelling and cramping, so pt would like to avoid this med completely. She would like for pt to go back on lisinopril HCTZ 10/12.5mg  and have a new rx sent in. Asked her to call back w/ further questions or concerns.

## 2013-12-13 NOTE — Telephone Encounter (Signed)
Pt wife called back inquiring about different medication being called in. Please call back

## 2014-01-03 ENCOUNTER — Telehealth: Payer: Self-pay

## 2014-01-03 ENCOUNTER — Emergency Department: Payer: Self-pay | Admitting: Emergency Medicine

## 2014-01-03 LAB — BASIC METABOLIC PANEL
ANION GAP: 4 — AB (ref 7–16)
BUN: 11 mg/dL (ref 7–18)
CALCIUM: 8.8 mg/dL (ref 8.5–10.1)
Chloride: 103 mmol/L (ref 98–107)
Co2: 30 mmol/L (ref 21–32)
Creatinine: 1.41 mg/dL — ABNORMAL HIGH (ref 0.60–1.30)
EGFR (African American): >60
EGFR (Non-African Amer.): 58 — ABNORMAL LOW
Glucose: 97 mg/dL (ref 65–99)
OSMOLALITY: 273 (ref 275–301)
POTASSIUM: 3.9 mmol/L (ref 3.5–5.1)
Sodium: 137 mmol/L (ref 136–145)

## 2014-01-03 LAB — CBC
HCT: 40.8 % (ref 40.0–52.0)
HGB: 14 g/dL (ref 13.0–18.0)
MCH: 29.8 pg (ref 26.0–34.0)
MCHC: 34.3 g/dL (ref 32.0–36.0)
MCV: 87 fL (ref 80–100)
Platelet: 176 10*3/uL (ref 150–440)
RBC: 4.69 10*6/uL (ref 4.40–5.90)
RDW: 13.3 % (ref 11.5–14.5)
WBC: 7.5 10*3/uL (ref 3.8–10.6)

## 2014-01-03 LAB — TROPONIN I: Troponin-I: <0.02 ng/mL

## 2014-01-03 NOTE — Telephone Encounter (Signed)
No CAD by cardiac cath He does have paroxysmal atrial fib, on medications for that Chronic chest pain sx, not from CAD

## 2014-01-03 NOTE — Telephone Encounter (Signed)
Pt's wife called stating that pt is at urgent care w/ cp & sob and they are advising him go to the ED. Pt's wife would prefer for pt to be seen in our office.  Advised pt that I can work him in this afternoon w/ Ward Givens, NP, but if Urgent Care advised him to go to ED, he should go.  Advised pt that Dr. Mariah Milling is on call and if he needs a cardiology consult, they will page him.  Pt's wife states that he is agreeable to this and will proceed to the [facility] ED.

## 2014-01-04 NOTE — Telephone Encounter (Signed)
Spoke w/ pt's wife.  She reports that pt went to [facility] ED yesterday and left after waiting for 4 hrs.  Pt did not receive results of labs.  Reviewed these w/ her. Advised her that I had spoken w/ Dr. Mariah Milling and that his sx are not r/t CAD. Reports that pt is under stress and his job requires him to do heavy lifting.  Advised her to have pt keep appt w/ Dr. Mariah Milling on 01/17/13, but to call PCP today. She is agreeable to this and will call if she feels that pt needs to be seen sooner.

## 2014-01-17 ENCOUNTER — Ambulatory Visit: Payer: BC Managed Care – PPO | Admitting: Cardiovascular Disease

## 2014-01-30 ENCOUNTER — Encounter: Payer: Self-pay | Admitting: Cardiovascular Disease

## 2014-01-30 ENCOUNTER — Ambulatory Visit (INDEPENDENT_AMBULATORY_CARE_PROVIDER_SITE_OTHER): Payer: BC Managed Care – PPO | Admitting: Cardiovascular Disease

## 2014-01-30 VITALS — BP 120/80 | HR 65 | Ht 69.0 in | Wt 237.8 lb

## 2014-01-30 DIAGNOSIS — R5381 Other malaise: Secondary | ICD-10-CM

## 2014-01-30 DIAGNOSIS — R5383 Other fatigue: Secondary | ICD-10-CM

## 2014-01-30 DIAGNOSIS — I10 Essential (primary) hypertension: Secondary | ICD-10-CM

## 2014-01-30 DIAGNOSIS — R079 Chest pain, unspecified: Secondary | ICD-10-CM

## 2014-01-30 DIAGNOSIS — I4891 Unspecified atrial fibrillation: Secondary | ICD-10-CM

## 2014-01-30 DIAGNOSIS — R531 Weakness: Secondary | ICD-10-CM

## 2014-01-30 DIAGNOSIS — M109 Gout, unspecified: Secondary | ICD-10-CM

## 2014-01-30 DIAGNOSIS — N529 Male erectile dysfunction, unspecified: Secondary | ICD-10-CM

## 2014-01-30 DIAGNOSIS — R0602 Shortness of breath: Secondary | ICD-10-CM

## 2014-01-30 MED ORDER — COLCHICINE 0.6 MG PO TABS: 0.6000 mg | ORAL_TABLET | Freq: Two times a day (BID) | ORAL | Status: DC | PRN

## 2014-01-30 NOTE — Assessment & Plan Note (Signed)
We have suggested we check thyroid and testosterone in the morning. He has requested a lab order to have this drawn as he does not live near a lab draw facility. He will call primary care to see if they are able to draw this blood in the morning

## 2014-01-30 NOTE — Progress Notes (Signed)
Patient ID: Randy Schneider, male    DOB: 08-Jun-1966, 48 y.o.   MRN: [id number]  HPI Comments: Randy Schneider is a 48 year old gentleman with a history of paroxysmal atrial fibrillation, chronic chest pain, chronic neck pain , depression, obstructive sleep apnea who wear CPAP, migraines, shortness of breath starting back > 5 years ago who presents for routine. He had a cardiac catheterization in March 2013 showing no significant coronary artery disease. He is seen at [facility].  In followup today, he reports significant fatigue, feeling weaker over the course of the year. Also having flareup of his gout. He was told that there was nothing that he could take. He does have some indomethacin at home but only several pills. Feels that he is sleeping okay. He was supposed to have his CPAP pressures checked but this has not been arranged by primary care per the wife. He does not do any exercise as he is very tired when he gets home. Blood pressure is well controlled at home. He has not needed to take lisinopril HCTZ.  Recent trip to the emergency room for chest pain on 01/03/2014. Workup was normal apart from sinus bradycardia. Cardiac enzymes negative He does have nitroglycerin.  Post cardiac catheterization  he developed a migraine, noted to have atrial fibrillation with heart rate 110 beats per minute up to 150 beats per minute. He was started on metoprolol tartrate 50 mg twice a day   Prior symptoms possibly from paroxysmal atrial fibrillation Previous shortness of breath with exertion, chest pain with exertion, periods of left arm pain, numbness in his hand.  Occasionally had to go home from work secondary to symptoms Records indicate stress testing in October 2010 showing no ischemia. 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 He had repeat nuclear Myoview in 2012 in [place] again showing no ischemia. Images were also reviewed by myself with the patient. They appeared normal. Repeat echo stress  test at outside office may 2013 that showed no evidence of ischemia. Otherwise echocardiogram is normal  Echocardiogram October 2010 showing essentially normal echocardiogram with normal ejection fraction. No mention of dilated right ventricle right atrium  Reports that he was told by outside cardiologist that his right atrium and right ventricle were dilated, and that he needed oxygen. He reports that he has oxygen at home.  EKG today shows normal sinus rhythm with rate 65 beats per minute, no significant ST or T wave changes   Outpatient Encounter Prescriptions as of 01/30/2014  Medication Sig  . aspirin 81 MG tablet Take 81 mg by mouth daily.  . Edoxaban Tosylate (SAVAYSA) 60 MG TABS Take 60 mg by mouth daily.  . furosemide (LASIX) 20 MG tablet Take 1 tablet (20 mg total) by mouth as needed (for fullness and/or leg swelling).  Marland Kitchen lisinopril-hydrochlorothiazide (PRINZIDE,ZESTORETIC) 10-12.5 MG per tablet Take 1 tablet by mouth daily as needed. He has not been taking this for several weeks   . metoprolol (LOPRESSOR) 50 MG tablet Take 1 tablet (50 mg total) by mouth 2 (two) times daily.  . NON FORMULARY CPAP DAILY  . NON FORMULARY Oxygen 3 Liters   Review of Systems  Constitutional: Positive for fatigue.  HENT: Negative.   Eyes: Negative.   Respiratory: Negative.   Cardiovascular: Negative.   Gastrointestinal: Negative.   Endocrine: Negative.   Musculoskeletal: Negative.   Skin: Negative.   Allergic/Immunologic: Negative.   Neurological: Positive for weakness.  Hematological: Negative.   Psychiatric/Behavioral: Positive for dysphoric mood.  All other systems  reviewed and are negative.   BP 120/80  Pulse 65  Ht 5\' 9"  (1.753 m)  Wt 237 lb 12 oz (107.843 kg)  BMI 35.09 kg/m2  Physical Exam  Nursing note and vitals reviewed. Constitutional: He is oriented to person, place, and time. He appears well-developed and well-nourished.  HENT:  Head: Normocephalic.  Nose: Nose  normal.  Mouth/Throat: Oropharynx is clear and moist.  Eyes: Conjunctivae are normal. Pupils are equal, round, and reactive to light.  Neck: Normal range of motion. Neck supple. No JVD present.  Cardiovascular: Normal rate, regular rhythm, S1 normal, S2 normal, normal heart sounds and intact distal pulses.  Exam reveals no gallop and no friction rub.   No murmur heard. Pulmonary/Chest: Effort normal and breath sounds normal. No respiratory distress. He has no wheezes. He has no rales. He exhibits no tenderness.  Abdominal: Soft. Bowel sounds are normal. He exhibits no distension. There is no tenderness.  Musculoskeletal: Normal range of motion. He exhibits no edema and no tenderness.  Lymphadenopathy:    He has no cervical adenopathy.  Neurological: He is alert and oriented to person, place, and time. Coordination normal.  Skin: Skin is warm and dry. No rash noted. No erythema.  Psychiatric: He has a normal mood and affect. His behavior is normal. Judgment and thought content normal.      Assessment and Plan

## 2014-01-30 NOTE — Assessment & Plan Note (Signed)
Atypical type chest pain. Prior cardiac catheterization showing no significant CAD. Suggested he use nitroglycerin one half or full pill sublingual only if this helps alleviate his discomfort in an effort to avoid the emergency room.

## 2014-01-30 NOTE — Assessment & Plan Note (Signed)
Blood pressure is well controlled today on metoprolol. No other medications added

## 2014-01-30 NOTE — Assessment & Plan Note (Signed)
Blood pressure well controlled on metoprolol alone. No other medications added

## 2014-01-30 NOTE — Patient Instructions (Addendum)
You are doing well from a cardiac perspective.  Please take the colchicine up to twice a day as needed for gout flare.  Please take the lab forms to have labs drawn with your primary care  Please call us if you have new issues that need to be addressed before your next appt.  Your physician wants you to follow-up in: 6 months.  You will receive a reminder letter in the mail two months in advance. If you don't receive a letter, please call our office to schedule the follow-up appointment.

## 2014-01-30 NOTE — Assessment & Plan Note (Signed)
He does not appear to be having breakthrough atrial fibrillation on his metoprolol. No symptoms concerning for palpitations or shortness of breath

## 2014-01-30 NOTE — Assessment & Plan Note (Addendum)
He reports having gout in a toe and ankle. He does have indomethacin at home. We have given him a prescription for colchicine 0.6 mg twice a day to take as needed. Recommended he have a basic metabolic panel when he sees primary care in one month

## 2014-01-30 NOTE — Assessment & Plan Note (Signed)
Rectal dysfunction was not discussed with him today. We will check a testosterone

## 2014-04-13 ENCOUNTER — Other Ambulatory Visit: Payer: Self-pay | Admitting: Cardiovascular Disease

## 2014-04-14 LAB — TSH: TSH: 1.71 u[IU]/mL (ref 0.450–4.500)

## 2014-04-14 LAB — TESTOSTERONE,FREE AND TOTAL: Testosterone, Free: 7.4 pg/mL (ref 6.8–21.5)

## 2014-04-26 ENCOUNTER — Telehealth: Payer: Self-pay | Admitting: *Deleted

## 2014-04-26 NOTE — Telephone Encounter (Signed)
Reviewed results w/ pt's wife.  She verbalizes understanding and will call back w/ any questions or concerns.  

## 2014-04-26 NOTE — Telephone Encounter (Signed)
Please call wife with lab results from [facility]. Thanks

## 2014-10-30 ENCOUNTER — Ambulatory Visit (INDEPENDENT_AMBULATORY_CARE_PROVIDER_SITE_OTHER): Payer: BLUE CROSS/BLUE SHIELD | Admitting: Cardiovascular Disease

## 2014-10-30 ENCOUNTER — Encounter: Payer: Self-pay | Admitting: Cardiovascular Disease

## 2014-10-30 VITALS — BP 120/80 | HR 68 | Ht 69.0 in | Wt 244.2 lb

## 2014-10-30 DIAGNOSIS — I10 Essential (primary) hypertension: Secondary | ICD-10-CM | POA: Diagnosis not present

## 2014-10-30 DIAGNOSIS — M79604 Pain in right leg: Secondary | ICD-10-CM

## 2014-10-30 DIAGNOSIS — R0602 Shortness of breath: Secondary | ICD-10-CM | POA: Diagnosis not present

## 2014-10-30 DIAGNOSIS — R079 Chest pain, unspecified: Secondary | ICD-10-CM

## 2014-10-30 DIAGNOSIS — M79605 Pain in left leg: Secondary | ICD-10-CM

## 2014-10-30 DIAGNOSIS — I4891 Unspecified atrial fibrillation: Secondary | ICD-10-CM

## 2014-10-30 DIAGNOSIS — M79606 Pain in leg, unspecified: Secondary | ICD-10-CM

## 2014-10-30 NOTE — Progress Notes (Signed)
Patient ID: Randy Schneider, male    DOB: Jul 12, 1966, 49 y.o.   MRN: [id number]  HPI Comments: Mr. Chong is a 49 year old gentleman with a history of paroxysmal atrial fibrillation, chronic chest pain, chronic neck pain , depression, obstructive sleep apnea who wear CPAP, migraines, shortness of breath starting back > 5 years ago who presents for routine of his shortness of breath, atrial fibrillation. He had a cardiac catheterization in March 2013 showing no significant coronary artery disease. He is seen at [facility].   in follow up today, he reports having 3-4 episodes of atrial fibrillation in the past 3 months.   he is taking metoprolol tartrate 25 mg twice a day , occasionally takes flecainide 50 mg when necessary for atrial fibrillation episodes   rarely, atrial fibrillation lasts several hours. He has chronic fatigue, wears his CPAP. Continues to have significant fatigue  He does not do any exercise as he is very tired when he gets home. Blood pressure is well controlled at home. He has not needed to take lisinopril HCTZ.  EKG on today's visit shows normal sinus rhythm with rate 68 bpm, no significant ST or T-wave changes   Other past medical history   trip to the emergency room for chest pain on 01/03/2014. Workup was normal apart from sinus bradycardia. Cardiac enzymes negative  Post cardiac catheterization  he developed a migraine, noted to have atrial fibrillation with heart rate 110 beats per minute up to 150 beats per minute. He was started on metoprolol tartrate 50 mg twice a day   Prior symptoms possibly from paroxysmal atrial fibrillation Previous shortness of breath with exertion, chest pain with exertion, periods of left arm pain, numbness in his hand.  Occasionally had to go home from work secondary to symptoms Records indicate stress testing in October 2010 showing no ischemia. 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 He had repeat nuclear Myoview in 2012 in [place]  again showing no ischemia. Images were also reviewed by myself with the patient. They appeared normal. Repeat echo stress test at outside office may 2013 that showed no evidence of ischemia. Otherwise echocardiogram is normal  Echocardiogram October 2010 showing essentially normal echocardiogram with normal ejection fraction. No mention of dilated right ventricle right atrium  Reports that he was told by outside cardiologist that his right atrium and right ventricle were dilated, and that he needed oxygen. He reports that he has oxygen at home.   Allergies  Allergen Reactions  . Codeine Other (See Comments)    Nervous   . Shrimp [Shellfish Allergy]   . Sulfa Antibiotics Rash    Outpatient Encounter Prescriptions as of 10/30/2014  Medication Sig  . colchicine 0.6 MG tablet Take 1 tablet (0.6 mg total) by mouth 2 (two) times daily as needed.  . Edoxaban Tosylate (SAVAYSA) 60 MG TABS Take 60 mg by mouth daily.  . furosemide (LASIX) 20 MG tablet Take 1 tablet (20 mg total) by mouth as needed (for fullness and/or leg swelling).  Marland Kitchen lisinopril-hydrochlorothiazide (PRINZIDE,ZESTORETIC) 10-12.5 MG per tablet Take 1 tablet by mouth daily as needed.  . metoprolol (LOPRESSOR) 50 MG tablet Take 1 tablet (50 mg total) by mouth 2 (two) times daily.  . NON FORMULARY CPAP DAILY  . NON FORMULARY Oxygen 3 Liters  . [DISCONTINUED] aspirin 81 MG tablet Take 81 mg by mouth daily.  . flecainide (TAMBOCOR) 50 MG tablet Take 1 tablet (50 mg total) by mouth 2 (two) times daily as needed.  Past Medical History  Diagnosis Date  . Hypertension   . CHF (congestive heart failure)   . Sleep apnea   . Leaky heart valve   . Hiatal hernia   . Depression   . Pure hyperglyceridemia   . Impotence of organic origin   . Cervical spondylosis without myelopathy   . Cardiomegaly   . Acute gastritis with hemorrhage   . Migraine without aura, without mention of intractable migraine without mention of status  migrainosus   . Lumbago   . A-fib     Past Surgical History  Procedure Laterality Date  . Carpal tunnel release Right 1994  . Back surgery  1999  . Rotator cuff repair Left 2003  . Nasal sinus surgery  2012    Social History  reports that he has never smoked. He has never used smokeless tobacco. He reports that he drinks alcohol. He reports that he does not use illicit drugs.  Family History family history includes Arrhythmia in his brother and sister; Heart attack in his father and mother; Heart disease in his brother; Heart failure in his father; Leukemia in his mother.  Review of Systems  Constitutional: Positive for fatigue.  HENT: Negative.   Respiratory: Negative.   Cardiovascular: Positive for palpitations.  Gastrointestinal: Negative.   Musculoskeletal: Negative.   Skin: Negative.   Neurological: Positive for weakness.  Hematological: Negative.   Psychiatric/Behavioral: Positive for dysphoric mood.  All other systems reviewed and are negative.   BP 120/80 mmHg  Pulse 68  Ht 5\' 9"  (1.753 m)  Wt 244 lb 4 oz (110.791 kg)  BMI 36.05 kg/m2  Physical Exam  Constitutional: He is oriented to person, place, and time. He appears well-developed and well-nourished.  HENT:  Head: Normocephalic.  Nose: Nose normal.  Mouth/Throat: Oropharynx is clear and moist.  Eyes: Conjunctivae are normal. Pupils are equal, round, and reactive to light.  Neck: Normal range of motion. Neck supple. No JVD present.  Cardiovascular: Normal rate, regular rhythm, S1 normal, S2 normal, normal heart sounds and intact distal pulses.  Exam reveals no gallop and no friction rub.   No murmur heard. Pulmonary/Chest: Effort normal and breath sounds normal. No respiratory distress. He has no wheezes. He has no rales. He exhibits no tenderness.  Abdominal: Soft. Bowel sounds are normal. He exhibits no distension. There is no tenderness.  Musculoskeletal: Normal range of motion. He exhibits no edema or  tenderness.  Lymphadenopathy:    He has no cervical adenopathy.  Neurological: He is alert and oriented to person, place, and time. Coordination normal.  Skin: Skin is warm and dry. No rash noted. No erythema.  Psychiatric: He has a normal mood and affect. His behavior is normal. Judgment and thought content normal.      Assessment and Plan   Nursing note and vitals reviewed.

## 2014-10-30 NOTE — Assessment & Plan Note (Signed)
Atypical type chest pain in the past. Prior cardiac catheterization showing no significant CAD.  No further workup at this time

## 2014-10-30 NOTE — Assessment & Plan Note (Signed)
Blood pressure is well controlled on today's visit. No changes made to the medications. 

## 2014-10-30 NOTE — Patient Instructions (Signed)
You are doing well. No medication changes were made.  If you have atrial fibrillation,  Take flecainide as needed  OK to take extra 1/2 metoprolol as needed  Please call us if you have new issues that need to be addressed before your next appt.  Your physician wants you to follow-up in: 6 months.  You will receive a reminder letter in the mail two months in advance. If you don't receive a letter, please call our office to schedule the follow-up appointment.

## 2014-10-30 NOTE — Assessment & Plan Note (Signed)
Occasional episodes of atrial fibrillation typically resolve on his metoprolol, flecainide when necessary We have recommended if he starts to have more frequent episodes that he call the office. We would start flecainide 50 mg twice a day on a regular basis We will continue anticoagulation

## 2014-10-30 NOTE — Assessment & Plan Note (Signed)
He reports having bilateral pain in his feet, below the knees. Worse in the morning in the knee area. He is concerned about vascular disease. He was told in the past that he had poor pulses in his legs by the nurse when he had his catheterization several years ago. We have ordered ankle-brachial indexes, lower extremity Doppler

## 2014-10-30 NOTE — Assessment & Plan Note (Signed)
Previously with symptoms of shortness of breath. No underlying coronary disease based on catheterization. Suspect secondary to deconditioning and obesity

## 2014-11-04 ENCOUNTER — Other Ambulatory Visit: Payer: Self-pay | Admitting: Cardiovascular Disease

## 2014-11-09 ENCOUNTER — Encounter (INDEPENDENT_AMBULATORY_CARE_PROVIDER_SITE_OTHER): Payer: BLUE CROSS/BLUE SHIELD

## 2014-11-09 DIAGNOSIS — M79604 Pain in right leg: Secondary | ICD-10-CM

## 2014-11-09 DIAGNOSIS — M79605 Pain in left leg: Secondary | ICD-10-CM | POA: Diagnosis not present

## 2014-12-27 ENCOUNTER — Other Ambulatory Visit: Payer: Self-pay | Admitting: Cardiovascular Disease

## 2015-01-01 ENCOUNTER — Other Ambulatory Visit: Payer: Self-pay | Admitting: Cardiovascular Disease

## 2015-05-06 ENCOUNTER — Encounter: Payer: Self-pay | Admitting: Cardiovascular Disease

## 2015-05-06 ENCOUNTER — Ambulatory Visit (INDEPENDENT_AMBULATORY_CARE_PROVIDER_SITE_OTHER): Payer: BLUE CROSS/BLUE SHIELD | Admitting: Cardiovascular Disease

## 2015-05-06 VITALS — BP 114/78 | HR 78 | Ht 69.0 in | Wt 233.5 lb

## 2015-05-06 DIAGNOSIS — R0602 Shortness of breath: Secondary | ICD-10-CM

## 2015-05-06 DIAGNOSIS — I4891 Unspecified atrial fibrillation: Secondary | ICD-10-CM | POA: Diagnosis not present

## 2015-05-06 DIAGNOSIS — I10 Essential (primary) hypertension: Secondary | ICD-10-CM

## 2015-05-06 NOTE — Assessment & Plan Note (Signed)
Shortness of breath has been a chronic long-standing issue. Normal coronary arteries. He denies any arrhythmia. Possibly deconditioning. Recommended he take Lasix as needed if symptoms get worse

## 2015-05-06 NOTE — Assessment & Plan Note (Signed)
Blood pressure is well controlled on today's visit. No changes made to the medications. 

## 2015-05-06 NOTE — Patient Instructions (Signed)
You are doing well. No medication changes were made.  Please call us if you have new issues that need to be addressed before your next appt.  Your physician wants you to follow-up in: 12 months.  You will receive a reminder letter in the mail two months in advance. If you don't receive a letter, please call our office to schedule the follow-up appointment. 

## 2015-05-06 NOTE — Progress Notes (Signed)
Patient ID: Randy Schneider, male    DOB: [birth year], 49 y.o.   MRN: [id number]  HPI Comments: [name] is a 49 year old gentleman with a history of paroxysmal atrial fibrillation, chronic chest pain, chronic neck pain , depression, obstructive sleep apnea who wear CPAP, migraines, shortness of breath starting back > 5 years ago who presents for routine of his shortness of breath, atrial fibrillation.  He had a cardiac catheterization in March 2013 showing no significant coronary artery disease. He is seen at [facility] medical clinic. Chronic fatigue, sleep apnea, wears his CPAP  In follow-up, he reports that he is doing well. Denies any episodes of atrial fibrillation He has not had to take any flecainide. He does take this with him when he travels Reports blood pressure has been well-controlled. He does have shortness of breath at times with heavy exertion. Stable Denies any lower extremity edema. Reports that he is standing on concrete for 9 hours per day No regular exercise program No smoking history, no diabetes, he reports cholesterol is below average.  EKG on today's visit shows normal sinus rhythm with rate 78 bpm, no significant ST or T-wave changes   Other past medical history   trip to the emergency room for chest pain on 01/03/2014. Workup was normal apart from sinus bradycardia. Cardiac enzymes negative  Post cardiac catheterization  he developed a migraine, noted to have atrial fibrillation with heart rate 110 beats per minute up to 150 beats per minute. He was started on metoprolol tartrate 50 mg twice a day   Prior symptoms possibly from paroxysmal atrial fibrillation Previous shortness of breath with exertion, chest pain with exertion, periods of left arm pain, numbness in his hand.  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 Images were also reviewed by myself with the patient. They appeared normal. Repeat echo stress test at outside office may 2013 that showed no evidence of ischemia. Otherwise echocardiogram is normal  Echocardiogram October 2010 showing essentially normal echocardiogram with normal ejection fraction. No mention of dilated right ventricle right atrium  Reports that he was told by outside cardiologist that his right atrium and right ventricle were dilated, and that he needed oxygen. He reports that he has oxygen at home.   Allergies  Allergen Reactions  . Codeine Other (See Comments)    Nervous   . Shrimp [Shellfish Allergy]   . Sulfa Antibiotics Rash    Outpatient Encounter Prescriptions as of 05/06/2015  Medication Sig  . colchicine 0.6 MG tablet TAKE 1 TABLET (0.6 MG TOTAL) BY MOUTH 2 (TWO) TIMES DAILY AS NEEDED.  Marland Kitchen DM-Phenylephrine-Acetaminophen (ALKA-SELTZER PLS SINUS & COUGH PO) Take by mouth as needed.  . flecainide (TAMBOCOR) 50 MG tablet Take 1 tablet (50 mg total) by mouth 2 (two) times daily as needed.  . furosemide (LASIX) 20 MG tablet Take 1 tablet (20 mg total) by mouth as needed (for fullness and/or leg swelling).  Marland Kitchen lisinopril-hydrochlorothiazide (PRINZIDE,ZESTORETIC) 10-12.5 MG per tablet TAKE 1 TABLET BY MOUTH DAILY.  . metoprolol (LOPRESSOR) 50 MG tablet Take 1 tablet (50 mg total) by mouth 2 (two) times daily.  . NON FORMULARY CPAP DAILY  . NON FORMULARY Oxygen 3 Liters  . SAVAYSA 60 MG TABS tablet TAKE 1 TABLET BY MOUTH EVERY DAY  . [DISCONTINUED] lisinopril-hydrochlorothiazide (PRINZIDE,ZESTORETIC) 10-12.5 MG  per tablet Take 1 tablet by mouth daily as needed.   No facility-administered encounter medications on file as of 05/06/2015.    Past Medical History  Diagnosis Date  . Hypertension   . CHF (congestive heart failure) (HCC)   . Sleep apnea   . Leaky heart valve   . Hiatal hernia   . Depression   . Pure  hyperglyceridemia   . Impotence of organic origin   . Cervical spondylosis without myelopathy   . Cardiomegaly   . Acute gastritis with hemorrhage   . Migraine without aura, without mention of intractable migraine without mention of status migrainosus   . Lumbago   . A-fib [facility])     Past Surgical History  Procedure Laterality Date  . Carpal tunnel release Right 1994  . Back surgery  1999  . Rotator cuff repair Left 2003  . Nasal sinus surgery  2012    Social History  reports that he has never smoked. He has never used smokeless tobacco. He reports that he drinks alcohol. He reports that he does not use illicit drugs.  Family History family history includes Arrhythmia in his brother and sister; Heart attack in his father and mother; Heart disease in his brother; Heart failure in his father; Leukemia in his mother.  Review of Systems  Constitutional: Positive for fatigue.  HENT: Negative.   Respiratory: Negative.   Cardiovascular: Negative.   Gastrointestinal: Negative.   Musculoskeletal: Negative.   Skin: Negative.   Neurological: Negative.   Hematological: Negative.   Psychiatric/Behavioral: Positive for dysphoric mood.  All other systems reviewed and are negative.   BP 114/78 mmHg  Pulse 78  Ht  (1.753 m)  Wt 233 lb 8 oz (105.915 kg)  BMI 34.47 kg/m2  Physical Exam  Constitutional: He is oriented to person, place, and time. He appears well-developed and well-nourished.  HENT:  Head: Normocephalic.  Nose: Nose normal.  Mouth/Throat: Oropharynx is clear and moist.  Eyes: Conjunctivae are normal. Pupils are equal, round, and reactive to light.  Neck: Normal range of motion. Neck supple. No JVD present.  Cardiovascular: Normal rate, regular rhythm, S1 normal, S2 normal, normal heart sounds and intact distal pulses.  Exam reveals no gallop and no friction rub.   No murmur heard. Pulmonary/Chest: Effort normal and breath sounds normal. No respiratory distress. He  has no wheezes. He has no rales. He exhibits no tenderness.  Abdominal: Soft. Bowel sounds are normal. He exhibits no distension. There is no tenderness.  Musculoskeletal: Normal range of motion. He exhibits no edema or tenderness.  Lymphadenopathy:    He has no cervical adenopathy.  Neurological: He is alert and oriented to person, place, and time. Coordination normal.  Skin: Skin is warm and dry. No rash noted. No erythema.  Psychiatric: He has a normal mood and affect. His behavior is normal. Judgment and thought content normal.      Assessment and Plan   Nursing note and vitals reviewed.

## 2015-05-06 NOTE — Assessment & Plan Note (Signed)
He denies any palpitations concerning for atrial fibrillation. High risk of recurrent arrhythmia given his sleep apnea. Recommended he stay on his CPAP. He will stay on anticoagulation. Currently not having any bleeding side effects

## 2015-07-31 ENCOUNTER — Other Ambulatory Visit: Payer: Self-pay

## 2015-07-31 ENCOUNTER — Telehealth: Payer: Self-pay | Admitting: *Deleted

## 2015-07-31 MED ORDER — FUROSEMIDE 20 MG PO TABS: 20.0000 mg | ORAL_TABLET | ORAL | Status: DC | PRN

## 2015-07-31 NOTE — Telephone Encounter (Signed)
S/w pt wife, Randy Schneider, who reports pt has increased oxygen from 2L to 4L in the past two weeks. States he has been more SOB while at rest and w/exertion. Wife asking for prescription to increase oxygen. Informed pt that the concern would be for the reason of his SOB.  Wife states oxygen was originally ordered by Dr. Gwen PoundsKowalski. Pt does not weigh self daily, denies leg swelling.  Pt has lasix to take PRN but it was last filled March 2015 and has expired.  Last OV w/Dr. Mariah MillingGollan was Oct 3.: "  Shortness of breath has been a chronic long-standing issue. Normal coronary arteries. He denies any arrhythmia. Possibly deconditioning. Recommended he take Lasix as needed if symptoms get worse"    Advised wife to notify pt PCP of SOB as well and I will forward note to Dr. Mariah MillingGollan to advise. Submitted lasix refill. Wife understands this is PRN medication.

## 2015-07-31 NOTE — Telephone Encounter (Signed)
Pt wife calling stating that they just had someone just come out and do their service for Oxygen 2 liters was the original prescription but pt is in need of doing 4 liters  [facility] on [address] in [place]  Please let them know if we can do this for them.

## 2015-08-06 NOTE — Telephone Encounter (Signed)
We need to get him established with pulmonary He also needs to see them for sleep apnea, CPAP  they would be the ones to assist with oxygen, whether it is needed, how much There has not been much cardiac reason for oxygen, our diagnosis codes will not work for him

## 2015-08-07 NOTE — Telephone Encounter (Signed)
Can we set him up w/ pulmonary?   Thank you!

## 2015-08-24 ENCOUNTER — Other Ambulatory Visit: Payer: Self-pay | Admitting: Cardiovascular Disease

## 2015-08-26 ENCOUNTER — Telehealth: Payer: Self-pay | Admitting: *Deleted

## 2015-08-26 ENCOUNTER — Institutional Professional Consult (permissible substitution): Payer: BLUE CROSS/BLUE SHIELD | Admitting: Internal Medicine

## 2015-08-26 NOTE — Telephone Encounter (Signed)
[facility] sent [place] a refill request for pt gout medication.  But they told patient that we denied it.  Would like to know what needs to be done in order to get refill Please advise.

## 2015-08-26 NOTE — Telephone Encounter (Signed)
Pt requesting Rx for colchicine last filled by Gollan. Please advise if ok to refill. See note below.

## 2015-08-26 NOTE — Telephone Encounter (Signed)
Would defer to PMD, Dr. Anna Genre There may be different or other medications that he migt want to use for treatment and then prevention, Labs might be needed, such as checking uric acid, etc

## 2015-08-27 NOTE — Telephone Encounter (Signed)
Spoke w/ pt's wife.  Advised her of Dr. Windell Hummingbird recommendation.  She will contact pt's PCP and call back if we can be of further assistance.

## 2015-08-30 ENCOUNTER — Encounter: Payer: Self-pay | Admitting: Internal Medicine

## 2015-08-30 ENCOUNTER — Ambulatory Visit (INDEPENDENT_AMBULATORY_CARE_PROVIDER_SITE_OTHER): Payer: BLUE CROSS/BLUE SHIELD | Admitting: Internal Medicine

## 2015-08-30 VITALS — BP 128/64 | HR 82 | Ht 69.0 in | Wt 235.2 lb

## 2015-08-30 DIAGNOSIS — R0600 Dyspnea, unspecified: Secondary | ICD-10-CM

## 2015-08-30 DIAGNOSIS — G4733 Obstructive sleep apnea (adult) (pediatric): Secondary | ICD-10-CM

## 2015-08-30 MED ORDER — FLUTICASONE PROPIONATE 50 MCG/ACT NA SUSP: 2.0000 | Freq: Every day | NASAL | Status: DC

## 2015-08-30 NOTE — Progress Notes (Signed)
[facility] Pulmonary Medicine Consultation      Assessment and Plan:  Obstructive sleep apnea. -I suspect that this is inadequately treated as the patient spends the first 2 hours of sleep on the couch without his CPAP. -We discussed that he needs to be in bed when he is initially sleeping and then put his CPAP on at that time. -We will be checking an auto titrating CPAP to make sure that his pressure is adequate and his apnea hypopnea index is reduced.  Nocturnal hypoxia, chronic respiratory failure. -Continue oxygen with sleep, may need to check overnight oximetry.  Atrial fibrillation, paroxysmal. -Sleep apnea may contribute to atrial fibrillation, therefore is important to continue to monitor this.  Dyspnea.  -We had a discussion about what could be contributing to his dyspnea, which may include check of sleep apnea versus other problems. He was ambulated around the office today with mild to minimal dyspnea with no evidence of desaturation. Could be that some of his dyspnea is purely subjective. -We will be checking a pulmonary function tests, echocardiogram, and an auto titrating CPAP to look for potential causes of his dyspnea. Meanwhile, the patient needs to be more regular with his CPAP, in terms of using it when he feels sleepy and using for the entire night.   Rhinitis -Started the patient on Flonase 2 sprays in each nostril at bedtime.   Date: 08/30/2015  MRN# [id number] Randy Schneider [birth year]  Referring Physician: Dr. Mariah Milling.   Randy Schneider is a 50 y.o. old male seen in consultation for chief complaint of:    Chief Complaint  Patient presents with  . SLEEP CONSULT    pt. ref. by dr. Lonie Peak.     HPI:  The patient is a 50 year old male with a history of Paroxysmal atrial fibrillation, chronic chest pain, depression, obstructive sleep apnea on CPAP. He is referred because of sleep apnea, respiratory issues, and need for oxygen at night.   He has been on  CPAP for at least 5 years. Patient is currently on CPAP at a pressure of 13, and feels that it is excessive when he has sinus congestion. He takes mucinex for sinus congestion. He typically goes to bed at 10 PM, falls asleep within 15-20 minutes. He typically falls asleep on the couch without his cpap, he naps there for about 2 hours, then goes to bed at 10. Wife notes that he is very sleepy during the day, and sleep on his lunch break.  He wakes up once per night, he gets out of bed at 4 AM. He notes he continues to be significantly sleepy. His Epworth score today is 22.  He notes that when he walks short distances he becomes short winded. This has been present for 4 or 5 years. The dyspnea has become worse over the years. He works in [employer] and has to walk in between plants and notes that it has become harder to get around at work. He is present with his wife today who supplies some of the history.  He uses oxygen at 2L but felt that he needed to turn it up to 4 because he was not getting enough.  No significant smoking history, but his wife smokes. His father smoked. They have 2 dogs at home, they sleep in bedroom, sometimes sleep in the bed.   He has never been on any inhalers and has never been diagnosed with lung disease.   He was ambulated around the office today, initial sat  was 98% and HR 84. After walking 900 feet sat was 95% and HR 90.    On Heart cath; 10/05/13; 31/14/22; wedge 15; PVR 1.05 [place] units. This would not appear to be consistent with  pulmonary hypertension. Echo 12/24/11; normal. No significant TR reported.  CXR 09/29/13; report negative, images not available.    PMHX:   Past Medical History  Diagnosis Date  . Hypertension   . CHF (congestive heart failure) (HCC)   . Sleep apnea   . Leaky heart valve   . Hiatal hernia   . Depression   . Pure hyperglyceridemia   . Impotence of organic origin   . Cervical spondylosis without myelopathy   . Cardiomegaly   . Acute  gastritis with hemorrhage   . Migraine without aura, without mention of intractable migraine without mention of status migrainosus   . Lumbago   . A-fib [facility])    Surgical Hx:  Past Surgical History  Procedure Laterality Date  . Carpal tunnel release Right 1994  . Back surgery  1999  . Rotator cuff repair Left 2003  . Nasal sinus surgery  2012   Family Hx:  Family History  Problem Relation Age of Onset  . Heart attack Mother   . Leukemia Mother   . Heart attack Father   . Heart failure Father   . Arrhythmia Sister     A-Fib  . Heart disease Brother   . Arrhythmia Brother     A-Fib   Social Hx:   Social History  Substance Use Topics  . Smoking status: Never Smoker   . Smokeless tobacco: Never Used  . Alcohol Use: Yes     Comment: occasional   Medication:   Current Outpatient Rx  Name  Route  Sig  Dispense  Refill  . colchicine 0.6 MG tablet      TAKE 1 TABLET (0.6 MG TOTAL) BY MOUTH 2 (TWO) TIMES DAILY AS NEEDED.   60 tablet   1   . DM-Phenylephrine-Acetaminophen (ALKA-SELTZER PLS SINUS & COUGH PO)   Oral   Take by mouth as needed.         . flecainide (TAMBOCOR) 50 MG tablet   Oral   Take 1 tablet (50 mg total) by mouth 2 (two) times daily as needed.         . furosemide (LASIX) 20 MG tablet   Oral   Take 1 tablet (20 mg total) by mouth as needed (for fullness and/or leg swelling).   30 tablet   3   . lisinopril-hydrochlorothiazide (PRINZIDE,ZESTORETIC) 10-12.5 MG per tablet      TAKE 1 TABLET BY MOUTH DAILY.   30 tablet   3   . metoprolol (LOPRESSOR) 50 MG tablet   Oral   Take 1 tablet (50 mg total) by mouth 2 (two) times daily.   60 tablet   6   . NON FORMULARY      CPAP DAILY         . NON FORMULARY      Oxygen 3 Liters         . SAVAYSA 60 MG TABS tablet      TAKE 1 TABLET BY MOUTH EVERY DAY   30 tablet   3       Allergies:  Codeine; Shrimp; and Sulfa antibiotics  Review of Systems: Gen:  Denies  fever, sweats,  chills HEENT: Denies blurred vision, double vision. Cvc:  No dizziness, chest pain. Resp:   Denies cough or  sputum porduction,  Gi: Denies swallowing difficulty, stomach pain. Gu:  Denies bladder incontinence, burning urine Ext:   No Joint pain, stiffness. Skin: No skin rash,  hives Endoc:  No polyuria, polydipsia. Psych: No depression, insomnia. Other:  All other systems were reviewed with the patient and were negative other that what is mentioned in the HPI.   Physical Examination:   VS: BP 128/64 mmHg  Pulse 82  Ht  (1.753 m)  Wt 235 lb 3.2 oz (106.686 kg)  BMI 34.72 kg/m2  SpO2 100%  General Appearance: No distress  Neuro:without focal findings,  speech normal,  HEENT: PERRLA, EOM intact.  Mallampati 3 Pulmonary: normal breath sounds, No wheezing.  CardiovascularNormal S1,S2.  No m/r/g.   Abdomen: Benign, Soft, non-tender. Renal:  No costovertebral tenderness  GU:  No performed at this time. Endoc: No evident thyromegaly, no signs of acromegaly. Skin:   warm, no rashes, no ecchymosis  Extremities: normal, no cyanosis, clubbing.  Other findings:    LABORATORY PANEL:   CBC No results for input(s): WBC, HGB, HCT, PLT in the last 168 hours. ------------------------------------------------------------------------------------------------------------------  Chemistries  No results for input(s): NA, K, CL, CO2, GLUCOSE, BUN, CREATININE, CALCIUM, MG, AST, ALT, ALKPHOS, BILITOT in the last 168 hours.  Invalid input(s): GFRCGP ------------------------------------------------------------------------------------------------------------------  Cardiac Enzymes No results for input(s): TROPONINI in the last 168 hours. ------------------------------------------------------------  RADIOLOGY:  No results found.     Thank  you for the consultation and for allowing [facility], [facility] to assist in the care of your patient. Our recommendations are noted  above.  Please contact us if we can be of further service.   Wells Guiles, MD.  Board Certified in Internal Medicine, Pulmonary Medicine, Critical Care Medicine, and Sleep Medicine.  [facility] [facility]  Santiago Glad, M.D.  Stephanie Acre, M.D.  Billy Fischer, M.D

## 2015-08-30 NOTE — Patient Instructions (Addendum)
CXR 2 view.   Echocardiogram with report of pulmonary artery pressure.   Complete PFT.   Go to bed when you are sleepy and use your cpap the entire night.   Keep pets out of the bedroom.   Auto-PAP; low pressure=10; high pressure = 16.   Start flonase 2 puffs in each nostril every night.

## 2015-09-04 ENCOUNTER — Telehealth: Payer: Self-pay

## 2015-09-04 NOTE — Telephone Encounter (Signed)
States they received an order for an auto pap. Please call.

## 2015-09-04 NOTE — Telephone Encounter (Signed)
Return call to Olegario Messier, who is no longer at [facility].  Spoke with Clydie Braun at [facility] and she will pull the order and return my call to advise me what is needed to process this order. Rhonda J Cobb

## 2015-09-05 NOTE — Telephone Encounter (Signed)
Received form from [facility] that they had be unable to reach patient and was asking for any additional phone numbers.  Contacted patient on mobile number, asked patient if he had received any calls from [facility]. Pt stated that his wife did last night.  Advised patient if there was a better number to reach him on to arrange set up. Pt gave phone number of [phone]. Contacted Karen at [facility] and provided this number to her and advised Clydie Braun if she was still unable to reach patient to let me know.  Nothing else needed at this time. Rhonda J Cobb

## 2015-09-06 ENCOUNTER — Ambulatory Visit
Admission: RE | Admit: 2015-09-06 | Discharge: 2015-09-06 | Disposition: A | Discharge status: Home / Self Care | Payer: BLUE CROSS/BLUE SHIELD | Source: Ambulatory Visit | Attending: Internal Medicine | Admitting: Internal Medicine

## 2015-09-06 ENCOUNTER — Other Ambulatory Visit: Payer: Self-pay

## 2015-09-06 ENCOUNTER — Ambulatory Visit (INDEPENDENT_AMBULATORY_CARE_PROVIDER_SITE_OTHER): Payer: BLUE CROSS/BLUE SHIELD | Admitting: *Deleted

## 2015-09-06 ENCOUNTER — Ambulatory Visit (INDEPENDENT_AMBULATORY_CARE_PROVIDER_SITE_OTHER): Payer: BLUE CROSS/BLUE SHIELD

## 2015-09-06 DIAGNOSIS — R0600 Dyspnea, unspecified: Secondary | ICD-10-CM

## 2015-09-06 DIAGNOSIS — I509 Heart failure, unspecified: Secondary | ICD-10-CM | POA: Insufficient documentation

## 2015-09-06 LAB — PULMONARY FUNCTION TEST
DL/VA % PRED: 112 %
DL/VA: 5.13 ml/min/mmHg/L
DLCO UNC % PRED: 92 %
DLCO unc: 28.73 ml/min/mmHg
FEF 25-75 PRE: 3.55 L/s
FEF 25-75 Post: 4.87 L/sec
FEF2575-%Change-Post: 36 %
FEF2575-%Pred-Post: 145 %
FEF2575-%Pred-Pre: 106 %
FEV1-%Change-Post: 10 %
FEV1-%PRED-PRE: 88 %
FEV1-%Pred-Post: 97 %
FEV1-Post: 3.67 L
FEV1-Pre: 3.34 L
FEV1FVC-%Change-Post: 2 %
FEV1FVC-%Pred-Pre: 108 %
FEV6-%CHANGE-POST: 7 %
FEV6-%PRED-POST: 90 %
FEV6-%PRED-PRE: 84 %
FEV6-POST: 4.24 L
FEV6-Pre: 3.95 L
FEV6FVC-%PRED-POST: 104 %
FEV6FVC-%Pred-Pre: 104 %
FVC-%Change-Post: 7 %
FVC-%PRED-POST: 87 %
FVC-%PRED-PRE: 81 %
FVC-POST: 4.24 L
FVC-PRE: 3.95 L
POST FEV6/FVC RATIO: 100 %
PRE FEV1/FVC RATIO: 84 %
Post FEV1/FVC ratio: 87 %
Pre FEV6/FVC Ratio: 100 %

## 2015-09-06 NOTE — Progress Notes (Signed)
PFT performed today with Nitrogen washout. 

## 2015-09-13 ENCOUNTER — Ambulatory Visit (INDEPENDENT_AMBULATORY_CARE_PROVIDER_SITE_OTHER): Payer: BLUE CROSS/BLUE SHIELD | Admitting: Internal Medicine

## 2015-09-13 ENCOUNTER — Encounter: Payer: Self-pay | Admitting: Internal Medicine

## 2015-09-13 VITALS — BP 132/70 | HR 69 | Ht 69.0 in | Wt 239.4 lb

## 2015-09-13 DIAGNOSIS — G4733 Obstructive sleep apnea (adult) (pediatric): Secondary | ICD-10-CM

## 2015-09-13 NOTE — Progress Notes (Signed)
[facility]     Assessment and Plan:  Obstructive sleep apnea. -He has increased his CPAP use, is now going directly to bed when sleepy and set a fall asleep on the couch. -We will await the results of the auto CPAP test and make any necessary adjustments to his CPAP pressure.   Atrial fibrillation, paroxysmal. -Sleep apnea may contribute to atrial fibrillation, therefore is important to continue to monitor this.  Dyspnea.  -Improved, encourage weight loss.   Rhinitis -Continue Flonase 2 sprays in each nostril at bedtime.   Date: 09/13/2015  MRN# [id number] Randy Schneider [birth year]   Randy Schneider is a 50 y.o. old male seen in follow up for chief complaint of  Chief Complaint  Patient presents with  . Follow-up    pt. states breathing is baseline. occ. SOB. denies cough, wheezing or chest pain/tightness. had PFT, echo & CXR     HPI:  The patient is a 50 year old male with dyspnea on exertion of uncertain etiology. He has a history of atrial fibrillation, sleep apnea, rhinitis. Last visit he was sent for a pulmonary function test, echocardiogram, an auto titrating CPAP.   He currently goes to bed at 9 pm and wakes at 3:30 to 4 am, he notes that he is less tired since going to bed earlier and not falling asleep on the couch today.  He notes that his dyspnea is actually improved since his last visit. 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    Echocardiogram 09/06/2015: EF is 65%. No reported pulmonary hypertension. PFT 09/06/2015: Reviewed tracings and raw data: FVC was 4.24 L, 81% of predicted. FEV1 was 3.67 L, 88% of predicted. FEV to FVC ratio is  108%. TLC was 76%, RV/TLC ratio was 65%, inspiratory capacity was 113%. Diffusion capacity was 92%. Overall this test shows moderate reduction Route residual volume with no evidence of primary obstructive or restrictive lung disease. This could be due to body  habitus.  Medication:   Outpatient Encounter Prescriptions as of 09/13/2015  Medication Sig  . colchicine 0.6 MG tablet TAKE 1 TABLET (0.6 MG TOTAL) BY MOUTH 2 (TWO) TIMES DAILY AS NEEDED.  Marland Kitchen DM-Phenylephrine-Acetaminophen (ALKA-SELTZER PLS SINUS & COUGH PO) Take by mouth as needed.  . flecainide (TAMBOCOR) 50 MG tablet Take 1 tablet (50 mg total) by mouth 2 (two) times daily as needed.  . fluticasone (FLONASE) 50 MCG/ACT nasal spray Place 2 sprays into both nostrils daily.  . furosemide (LASIX) 20 MG tablet Take 1 tablet (20 mg total) by mouth as needed (for fullness and/or leg swelling).  Marland Kitchen lisinopril-hydrochlorothiazide (PRINZIDE,ZESTORETIC) 10-12.5 MG per tablet TAKE 1 TABLET BY MOUTH DAILY.  . metoprolol (LOPRESSOR) 50 MG tablet Take 1 tablet (50 mg total) by mouth 2 (two) times daily.  . NON FORMULARY CPAP DAILY  . NON FORMULARY Oxygen 3 Liters  . SAVAYSA 60 MG TABS tablet TAKE 1 TABLET BY MOUTH EVERY DAY   No facility-administered encounter medications on file as of 09/13/2015.     Allergies:  Codeine; Shrimp; and Sulfa antibiotics  Review of Systems: Gen:  Denies  fever, sweats. HEENT: Denies blurred vision. Cvc:  No dizziness, chest pain or heaviness Resp:   Denies cough or sputum porduction. Gi: Denies swallowing difficulty, stomach pain. constipation, bowel incontinence Gu:  Denies bladder incontinence, burning urine Ext:   No Joint pain, stiffness. Skin: No skin rash, easy bruising. Endoc:  No polyuria, polydipsia. Psych: No  depression, insomnia. Other:  All other systems were reviewed and found to be negative other than what is mentioned in the HPI.   Physical Examination:   VS: BP 132/70 mmHg  Pulse 69  Ht  (1.753 m)  Wt 239 lb 6.4 oz (108.591 kg)  BMI 35.34 kg/m2  SpO2 98%  General Appearance: No distress  Neuro:without focal findings,  speech normal,  HEENT: PERRLA, EOM intact. Pulmonary: normal breath sounds, No wheezing.   CardiovascularNormal  S1,S2.  No m/r/g.   Abdomen: Benign, Soft, non-tender. Renal:  No costovertebral tenderness  GU:  Not performed at this time. Endoc: No evident thyromegaly, no signs of acromegaly. Skin:   warm, no rash. Extremities: normal, no cyanosis, clubbing.   LABORATORY PANEL:   CBC No results for input(s): WBC, HGB, HCT, PLT in the last 168 hours. ------------------------------------------------------------------------------------------------------------------  Chemistries  No results for input(s): NA, K, CL, CO2, GLUCOSE, BUN, CREATININE, CALCIUM, MG, AST, ALT, ALKPHOS, BILITOT in the last 168 hours.  Invalid input(s): GFRCGP ------------------------------------------------------------------------------------------------------------------  Cardiac Enzymes No results for input(s): TROPONINI in the last 168 hours. ------------------------------------------------------------  RADIOLOGY:   No results found for this or any previous visit. Results for orders placed during the hospital encounter of 09/06/15  DG Chest 2 View   Narrative CLINICAL DATA:  Nonsmoker, shortness of breath especially with exertion, on CPAP at night; history of CHF.  EXAM: CHEST  2 VIEW  COMPARISON:  PA and lateral chest x-ray of September 29, 2013  FINDINGS: The lungs are well-expanded. There is no focal infiltrate. There is no pleural effusion. The heart and pulmonary vascularity are normal. The mediastinum is normal in width. The bony thorax exhibits no acute abnormality.  IMPRESSION: There is no active cardiopulmonary disease.   Electronically Signed   By: David  [place] M.D.   On: 09/06/2015 13:00    ------------------------------------------------------------------------------------------------------------------  Thank  you for allowing [facility] to assist in the care of your patient. Our recommendations are noted above.  Please contact us if we can be of further  service.   Wells Guiles, MD.  [facility]   Santiago Glad, M.D.  Stephanie Acre, M.D.  Billy Fischer, M.D

## 2015-09-13 NOTE — Patient Instructions (Addendum)
--  Continue using cpap every night.  --Weight loss would be helpful for your overall health. --We will contact you when we get the results from your auto-CPAP test.

## 2015-09-22 ENCOUNTER — Encounter: Payer: Self-pay | Admitting: Internal Medicine

## 2015-11-03 ENCOUNTER — Other Ambulatory Visit: Payer: Self-pay | Admitting: Cardiovascular Disease

## 2016-10-31 ENCOUNTER — Other Ambulatory Visit: Payer: Self-pay | Admitting: Cardiovascular Disease

## 2017-04-01 NOTE — Progress Notes (Signed)
Cardiology Office Note  Date:  04/02/2017   ID:  Randy Schneider, DOB Feb 19, 1966, MRN [id number]  PCP:  Randy Peak, PA-C   Chief Complaint  Patient presents with  . other    LS 2016 c/o left neck pain and lightheadedness. Meds reviewed verbally with pt.    HPI:  Randy Schneider is a 51 year old gentleman with a history of  paroxysmal atrial fibrillation,  chronic chest pain,  chronic neck pain ,  depression,  obstructive sleep apnea who wear CPAP,  migraines,  shortness of breath starting back > 5 years ago  cardiac catheterization in March 2013 showing no significant coronary artery disease. He is seen at [facility] medical clinic. Chronic fatigue, sleep apnea, wears his CPAP who presents for routine of his shortness of breath, atrial fibrillation.   In follow-up today he reports that he is been doing well One episode of atrial fibrillation approximately 6 to 8 months ago Sleeping the couch , wife for him to get into bed, startled him, went into atrial  Symptoms lasted 30 minutes, took flecainide 2.  None since then  Compliant with his CPAP Feels that it is not working well Feels pressure are not right, not sleeping as well Previously seen by pulmonary 09/2015  Reports blood pressure has been well-controlled. Working long hours Rare leg edema, takes Lasix when necessary Reports that he is standing on concrete for 9 hours per day No regular exercise program No smoking history, no diabetes, he reports cholesterol is below average. No recent lab work available  EKG on today's visit shows normal sinus rhythm with rate 61 bpm, no significant ST or T-wave changes   Other past medical history   trip to the emergency room for chest pain on 01/03/2014. Workup was normal apart from sinus bradycardia. Cardiac enzymes negative  Post cardiac catheterization  he developed a migraine, noted to have atrial fibrillation with heart rate 110 beats per minute up to 150 beats per minute.  He was started on metoprolol tartrate 50 mg twice a day   Prior symptoms possibly from paroxysmal atrial fibrillation Previous shortness of breath with exertion, chest pain with exertion, periods of left arm pain, numbness in his hand.  Occasionally had to go home from work secondary to symptoms Records indicate stress testing in October 2010 showing no ischemia. 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 He had repeat nuclear Myoview in 2012 in [place] again showing no ischemia. Images were also reviewed by myself with the patient. They appeared normal. Repeat echo stress test at outside office may 2013 that showed no evidence of ischemia. Otherwise echocardiogram is normal  Echocardiogram October 2010 showing essentially normal echocardiogram with normal ejection fraction. No mention of dilated right ventricle right atrium  Reports that he was told by outside cardiologist that his right atrium and right ventricle were dilated, and that he needed oxygen. He reports that he has oxygen at home.   PMH:   has a past medical history of A-fib (HCC); Acute gastritis with hemorrhage; Cardiomegaly; Cervical spondylosis without myelopathy; CHF (congestive heart failure) (HCC); Depression; Hiatal hernia; Hypertension; Impotence of organic origin; Leaky heart valve; Lumbago; Migraine without aura, without mention of intractable migraine without mention of status migrainosus; Pure hyperglyceridemia; and Sleep apnea.  PSH:    Past Surgical History:  Procedure Laterality Date  . BACK SURGERY  1999  . CARPAL TUNNEL RELEASE Right 1994  . NASAL SINUS SURGERY  2012  . ROTATOR CUFF REPAIR Left 2003    Current Outpatient  Prescriptions  Medication Sig Dispense Refill  . aspirin EC 81 MG tablet Take 81 mg by mouth daily.    . colchicine 0.6 MG tablet TAKE 1 TABLET (0.6 MG TOTAL) BY MOUTH 2 (TWO) TIMES DAILY AS NEEDED. 60 tablet 1  . febuxostat (ULORIC) 40 MG tablet Take 40 mg by mouth daily.    . flecainide  (TAMBOCOR) 50 MG tablet Take 1 tablet (50 mg total) by mouth 2 (two) times daily as needed. 60 tablet 5  . furosemide (LASIX) 20 MG tablet Take 1 tablet (20 mg total) by mouth as needed (for fullness and/or leg swelling). 30 tablet 4  . lisinopril-hydrochlorothiazide (PRINZIDE,ZESTORETIC) 10-12.5 MG tablet TAKE 1 TABLET BY MOUTH DAILY 30 tablet 3  . NON FORMULARY CPAP DAILY    . NON FORMULARY Oxygen 3 Liters     No current facility-administered medications for this visit.      Allergies:   Codeine; Shrimp [shellfish allergy]; and Sulfa antibiotics   Social History:  The patient  reports that he has never smoked. He has never used smokeless tobacco. He reports that he drinks alcohol. He reports that he does not use drugs.   Family History:   family history includes Arrhythmia in his brother and sister; Heart attack in his father and mother; Heart disease in his brother; Heart failure in his father; Leukemia in his mother.    Review of Systems: Review of Systems  Constitutional: Negative.   Respiratory: Negative.   Cardiovascular: Negative.   Gastrointestinal: Negative.   Musculoskeletal: Negative.   Neurological: Negative.   Psychiatric/Behavioral: Negative.   All other systems reviewed and are negative.    PHYSICAL EXAM: VS:  BP 140/86 (BP Location: Left Arm, Patient Position: Sitting, Cuff Size: Large)   Pulse 61   Ht 5\' 9"  (1.753 m)   Wt 239 lb 8 oz (108.6 kg)   BMI 35.37 kg/m  , BMI Body mass index is 35.37 kg/m. GEN: Well nourished, well developed, in no acute distress  HEENT: normal  Neck: no JVD, carotid bruits, or masses Cardiac: RRR; no murmurs, rubs, or gallops,no edema  Respiratory:  clear to auscultation bilaterally, normal work of breathing GI: soft, nontender, nondistended, + BS MS: no deformity or atrophy  Skin: warm and dry, no rash Neuro:  Strength and sensation are intact Psych: euthymic mood, full affect    Recent Labs: No results found for  requested labs within last 8760 hours.    Lipid Panel No results found for: CHOL, HDL, LDLCALC, TRIG    Wt Readings from Last 3 Encounters:  04/02/17 239 lb 8 oz (108.6 kg)  09/13/15 239 lb 6.4 oz (108.6 kg)  08/30/15 235 lb 3.2 oz (106.7 kg)       ASSESSMENT AND PLAN:  Paroxysmal atrial fibrillation (HCC) - Plan: EKG 12-Lead Rare episodes, taking medications as needed  Essential hypertension, benign - Plan: EKG 12-Lead Blood pressure is well controlled on today's visit. No changes made to the medications.  Shortness of breath - Plan: EKG 12-Lead Chronic mild symptoms, very to conditioning, weight Prior cardiac catheterization with no coronary disease  Neck pain Musculoskeletal in nature  OSA on CPAP Feels his pressures are not set right Has not seen pulmonary in on 1.5 years We will send them a message Uncertain if he is a candidate for auto titration machine   Total encounter time more than 25 minutes  Greater than 50% was spent in counseling and coordination of care with the patient  Disposition:   F/U  12 months   Orders Placed This Encounter  Procedures  . EKG 12-Lead     Signed, Dossie Arbourim Emelie Newsom, M.D., Ph.D. 04/02/2017  [facility] [place] [phone]

## 2017-04-02 ENCOUNTER — Ambulatory Visit (INDEPENDENT_AMBULATORY_CARE_PROVIDER_SITE_OTHER): Payer: BLUE CROSS/BLUE SHIELD | Admitting: Cardiovascular Disease

## 2017-04-02 ENCOUNTER — Encounter: Payer: Self-pay | Admitting: Cardiovascular Disease

## 2017-04-02 VITALS — BP 140/86 | HR 61 | Ht 69.0 in | Wt 239.5 lb

## 2017-04-02 DIAGNOSIS — R0602 Shortness of breath: Secondary | ICD-10-CM | POA: Diagnosis not present

## 2017-04-02 DIAGNOSIS — R079 Chest pain, unspecified: Secondary | ICD-10-CM

## 2017-04-02 DIAGNOSIS — Z9989 Dependence on other enabling machines and devices: Secondary | ICD-10-CM | POA: Diagnosis not present

## 2017-04-02 DIAGNOSIS — I480 Paroxysmal atrial fibrillation: Secondary | ICD-10-CM | POA: Diagnosis not present

## 2017-04-02 DIAGNOSIS — I10 Essential (primary) hypertension: Secondary | ICD-10-CM

## 2017-04-02 DIAGNOSIS — G4733 Obstructive sleep apnea (adult) (pediatric): Secondary | ICD-10-CM | POA: Diagnosis not present

## 2017-04-02 MED ORDER — FUROSEMIDE 20 MG PO TABS: 20.0000 mg | ORAL_TABLET | ORAL | 4 refills | Status: DC | PRN

## 2017-04-02 MED ORDER — FLECAINIDE ACETATE 50 MG PO TABS: 50.0000 mg | ORAL_TABLET | Freq: Two times a day (BID) | ORAL | 5 refills | Status: DC | PRN

## 2017-04-02 NOTE — Patient Instructions (Signed)

## 2017-04-30 ENCOUNTER — Telehealth: Payer: Self-pay | Admitting: Cardiovascular Disease

## 2017-04-30 NOTE — Telephone Encounter (Signed)
Patients wife states that at the last office visit Dr. Mariah Milling was going to consult with Dr. Ardyth Man regarding CPAP machine. She was just calling back to follow up on this. She didn't know if he would need another sleep study or if they could try a different machine. Reviewed with her that machines are different based on the patient and their needs. Let her know that he may need follow up appointment with pulmonary for any possible CPAP changes and that I would route message over to pulmonary and Dr. Mariah Milling for their review. She was appreciative for the call back and had no further questions or concerns at this time.

## 2017-04-30 NOTE — Telephone Encounter (Signed)
Pt wife calling regarding CPAP. States she has not heard anything from [place]. States DR. Mariah Milling was going to consult with Dr. Nicholos Johns.

## 2017-04-30 NOTE — Telephone Encounter (Signed)
Patient has been scheduled for cpap f/u 9/12.

## 2017-05-13 ENCOUNTER — Encounter: Payer: Self-pay | Admitting: Internal Medicine

## 2017-05-14 ENCOUNTER — Ambulatory Visit (INDEPENDENT_AMBULATORY_CARE_PROVIDER_SITE_OTHER): Payer: BLUE CROSS/BLUE SHIELD | Admitting: Internal Medicine

## 2017-05-14 ENCOUNTER — Encounter: Payer: Self-pay | Admitting: Internal Medicine

## 2017-05-14 VITALS — BP 138/90 | HR 81 | Resp 16 | Ht 69.0 in | Wt 241.0 lb

## 2017-05-14 DIAGNOSIS — R0602 Shortness of breath: Secondary | ICD-10-CM | POA: Diagnosis not present

## 2017-05-14 DIAGNOSIS — G4733 Obstructive sleep apnea (adult) (pediatric): Secondary | ICD-10-CM | POA: Diagnosis not present

## 2017-05-14 NOTE — Patient Instructions (Addendum)
--  Auto-PAP; low pressure=10; high pressure = 16.   --Overnight oximetry on current level of oxygen and CPAP.

## 2017-05-14 NOTE — Addendum Note (Signed)
Addended by: Alease Frame on: 05/14/2017 04:07 PM   Modules accepted: Orders

## 2017-05-14 NOTE — Progress Notes (Signed)
[facility]     Assessment and Plan:  Obstructive sleep apnea. -We will await the results of the auto CPAP test and make any necessary adjustments to his CPAP pressure. --Notes that he is waking up with choking sensation, download data indicates his OSA is controlled, however he may be experiencing desats or other phenomenon contributing.  --Check overnight oximetry on current oxygen level and current cpap level.   Atrial fibrillation, paroxysmal. -Sleep apnea may contribute to atrial fibrillation, therefore is important to continue to monitor this.  Dyspnea.  -Improved, encourage weight loss.   Rhinitis -Continue Flonase 2 sprays in each nostril at bedtime.   Date: 05/14/2017  MRN# [id number] Randy Schneider 11/11/1965   Randy Schneider is a 51 y.o. old male seen in follow up for chief complaint of  Chief Complaint  Patient presents with  . Sleep Apnea    Pt reports waking up feeling like he is choking for past 2 mos. This does not happen every night.      HPI:  The patient is a 51 year old male with dyspnea on exertion of uncertain etiology. He has a history of atrial fibrillation, sleep apnea, rhinitis. Last visit he was sent for a pulmonary function test, echocardiogram, an auto titrating CPAP. He apparently was set up on an auto titrating CPAP via his home care company, but these results were never sent to [place].  He continues to wake up about once per week with choking sensation, then takes the mask off for a few minutes, gets up and goes to the bathroom, then puts it back and goes back bed and will usually be ok.   Review of download dated 9/12-10/11/18: Usage greater than 4 hours is 83%, average usage on days used to 6 hours 17 minutes, set pressures. 11. Residual AHI is 1.6. Leaks are minimal.   **Echocardiogram 09/06/2015: EF is 65%. No reported pulmonary hypertension. **PFT 09/06/2015: Reviewed tracings and raw data: FVC was 4.24 L, 81% of  predicted. FEV1 was 3.67 L, 88% of predicted. FEV to FVC ratio is  108%. TLC was 76%, RV/TLC ratio was 65%, inspiratory capacity was 113%. Diffusion capacity was 92%. Overall this test shows moderate reduction Route residual volume with no evidence of primary obstructive or restrictive lung disease. This could be due to body habitus.  Medication:   Outpatient Encounter Prescriptions as of 05/14/2017  Medication Sig  . aspirin EC 81 MG tablet Take 81 mg by mouth daily.  . colchicine 0.6 MG tablet TAKE 1 TABLET (0.6 MG TOTAL) BY MOUTH 2 (TWO) TIMES DAILY AS NEEDED.  . febuxostat (ULORIC) 40 MG tablet Take 40 mg by mouth daily.  . flecainide (TAMBOCOR) 50 MG tablet Take 1 tablet (50 mg total) by mouth 2 (two) times daily as needed.  . furosemide (LASIX) 20 MG tablet Take 1 tablet (20 mg total) by mouth as needed (for fullness and/or leg swelling).  Marland Kitchen lisinopril-hydrochlorothiazide (PRINZIDE,ZESTORETIC) 10-12.5 MG tablet TAKE 1 TABLET BY MOUTH DAILY  . NON FORMULARY CPAP DAILY  . NON FORMULARY Oxygen 3 Liters   No facility-administered encounter medications on file as of 05/14/2017.      Allergies:  Codeine; Shrimp [shellfish allergy]; and Sulfa antibiotics  Review of Systems: Gen:  Denies  fever, sweats. HEENT: Denies blurred vision. Cvc:  No dizziness, chest pain or heaviness Resp:   Denies cough or sputum porduction. Gi: Denies swallowing difficulty, stomach pain. constipation, bowel incontinence Gu:  Denies bladder incontinence, burning urine Ext:  No Joint pain, stiffness. Skin: No skin rash, easy bruising. Endoc:  No polyuria, polydipsia. Psych: No depression, insomnia. Other:  All other systems were reviewed and found to be negative other than what is mentioned in the HPI.   Physical Examination:   VS: BP 138/90 (BP Location: Left Arm, Cuff Size: Normal)   Pulse 81   Resp 16   Ht  (1.753 m)   Wt 241 lb (109.3 kg)   SpO2 97%   BMI 35.59 kg/m   General Appearance: No  distress  Neuro:without focal findings,  speech normal,  HEENT: PERRLA, EOM intact. Pulmonary: normal breath sounds, No wheezing.   CardiovascularNormal S1,S2.  No m/r/g.   Abdomen: Benign, Soft, non-tender. Renal:  No costovertebral tenderness  GU:  Not performed at this time. Endoc: No evident thyromegaly, no signs of acromegaly. Skin:   warm, no rash. Extremities: normal, no cyanosis, clubbing.   LABORATORY PANEL:   CBC No results for input(s): WBC, HGB, HCT, PLT in the last 168 hours. ------------------------------------------------------------------------------------------------------------------  Chemistries  No results for input(s): NA, K, CL, CO2, GLUCOSE, BUN, CREATININE, CALCIUM, MG, AST, ALT, ALKPHOS, BILITOT in the last 168 hours.  Invalid input(s): GFRCGP ------------------------------------------------------------------------------------------------------------------  Cardiac Enzymes No results for input(s): TROPONINI in the last 168 hours. ------------------------------------------------------------  RADIOLOGY:   No results found for this or any previous visit. Results for orders placed during the hospital encounter of 09/06/15  DG Chest 2 View   Narrative CLINICAL DATA:  Nonsmoker, shortness of breath especially with exertion, on CPAP at night; history of CHF.  EXAM: CHEST  2 VIEW  COMPARISON:  PA and lateral chest x-ray of September 29, 2013  FINDINGS: The lungs are well-expanded. There is no focal infiltrate. There is no pleural effusion. The heart and pulmonary vascularity are normal. The mediastinum is normal in width. The bony thorax exhibits no acute abnormality.  IMPRESSION: There is no active cardiopulmonary disease.   Electronically Signed   By: David  [place] M.D.   On: 09/06/2015 13:00    ------------------------------------------------------------------------------------------------------------------  Thank  you for allowing [facility] to assist in the care of your patient. Our recommendations are noted above.  Please contact us if we can be of further service.   Wells Guiles, MD.  [facility]   Santiago Glad, M.D.  Billy Fischer, M.D

## 2017-05-14 NOTE — Addendum Note (Signed)
Addended by: Alease Frame on: 05/14/2017 04:38 PM   Modules accepted: Orders

## 2017-05-14 NOTE — Addendum Note (Signed)
Addended by: Alease Frame on: 05/14/2017 03:34 PM   Modules accepted: Orders

## 2017-05-20 ENCOUNTER — Telehealth: Payer: Self-pay | Admitting: Internal Medicine

## 2017-05-20 NOTE — Telephone Encounter (Signed)
Spoke with Archie Pattenonya at [facility] and after some investigation she found the order for the new CPAP along with the ONO. States they will get the order processed. Nothing further needed.

## 2017-05-20 NOTE — Telephone Encounter (Signed)
[facility] calling to clarify order Please call [phone] ext [phone]

## 2017-05-27 ENCOUNTER — Telehealth: Payer: Self-pay | Admitting: Internal Medicine

## 2017-05-27 NOTE — Telephone Encounter (Signed)
Received fax from [facility] that patient has chose to hold off on CPAP set up with 02 bled in at this time.  [facility] has cancelled the order at this time. If patient would like to move forward, at some point, please contact [facility] at [phone] to reactivate order.   Called patient and he stated that he would have to pay deductible twice, once for this year and then again for next year.  Pt stated that he planned on starting this in January. Advise patient to contact us so that we could reactive order with [facility]. Pt also requested that his appointment be cancelled as well, since this appointment was just to see how he was doing on the equipment. Advised patient that I would cancel and to get back in touch with us in January.  Also advised patient that the CPAP would benefit his health and to try to arrange set up in January. Pt stated that he understood and would attempt to.  Just FYI for physician. Randy Schneider

## 2017-06-02 NOTE — Telephone Encounter (Signed)
Phone message sent to physician and physician is aware. Roderic Palauhonda J Cobb Phone note closed. Rhonda J Cobb

## 2017-06-11 ENCOUNTER — Ambulatory Visit: Payer: BLUE CROSS/BLUE SHIELD | Admitting: Internal Medicine

## 2018-01-19 ENCOUNTER — Other Ambulatory Visit: Payer: Self-pay | Admitting: Nurse Practitioner

## 2018-01-19 DIAGNOSIS — M25561 Pain in right knee: Secondary | ICD-10-CM

## 2018-01-19 DIAGNOSIS — M25562 Pain in left knee: Principal | ICD-10-CM

## 2018-01-19 DIAGNOSIS — M5440 Lumbago with sciatica, unspecified side: Secondary | ICD-10-CM

## 2018-02-11 ENCOUNTER — Ambulatory Visit
Admission: RE | Admit: 2018-02-11 | Discharge: 2018-02-11 | Disposition: A | Discharge status: Home / Self Care | Payer: BLUE CROSS/BLUE SHIELD | Source: Ambulatory Visit | Attending: Nurse Practitioner | Admitting: Nurse Practitioner

## 2018-02-11 DIAGNOSIS — M170 Bilateral primary osteoarthritis of knee: Secondary | ICD-10-CM | POA: Diagnosis not present

## 2018-02-11 DIAGNOSIS — M25461 Effusion, right knee: Secondary | ICD-10-CM | POA: Insufficient documentation

## 2018-02-11 DIAGNOSIS — M25561 Pain in right knee: Secondary | ICD-10-CM | POA: Insufficient documentation

## 2018-02-11 DIAGNOSIS — M47817 Spondylosis without myelopathy or radiculopathy, lumbosacral region: Secondary | ICD-10-CM | POA: Diagnosis not present

## 2018-02-11 DIAGNOSIS — M25562 Pain in left knee: Secondary | ICD-10-CM | POA: Insufficient documentation

## 2018-02-11 DIAGNOSIS — L929 Granulomatous disorder of the skin and subcutaneous tissue, unspecified: Secondary | ICD-10-CM | POA: Diagnosis not present

## 2018-02-11 DIAGNOSIS — M5440 Lumbago with sciatica, unspecified side: Secondary | ICD-10-CM

## 2018-02-11 DIAGNOSIS — G8929 Other chronic pain: Secondary | ICD-10-CM | POA: Insufficient documentation

## 2018-02-11 DIAGNOSIS — M48061 Spinal stenosis, lumbar region without neurogenic claudication: Secondary | ICD-10-CM | POA: Diagnosis not present

## 2018-02-11 DIAGNOSIS — M545 Low back pain: Secondary | ICD-10-CM | POA: Diagnosis present

## 2018-02-11 LAB — POCT I-STAT CREATININE: Creatinine, Ser: 1.4 mg/dL — ABNORMAL HIGH (ref 0.61–1.24)

## 2018-02-11 MED ORDER — GADOBENATE DIMEGLUMINE 529 MG/ML IV SOLN
20.0000 mL | Freq: Once | INTRAVENOUS | Status: AC | PRN
  Administered 2018-02-11: 20 mL via INTRAVENOUS

## 2018-04-20 DIAGNOSIS — F419 Anxiety disorder, unspecified: Secondary | ICD-10-CM | POA: Diagnosis not present

## 2018-04-20 DIAGNOSIS — Z79899 Other long term (current) drug therapy: Secondary | ICD-10-CM | POA: Diagnosis not present

## 2018-04-20 DIAGNOSIS — Z1339 Encounter for screening examination for other mental health and behavioral disorders: Secondary | ICD-10-CM | POA: Diagnosis not present

## 2018-04-20 DIAGNOSIS — R51 Headache: Secondary | ICD-10-CM | POA: Diagnosis not present

## 2018-04-28 ENCOUNTER — Encounter: Payer: Self-pay | Admitting: Internal Medicine

## 2018-04-28 NOTE — Progress Notes (Signed)
[facility]     Assessment and Plan:  Obstructive sleep apnea. -Obstructive sleep apnea is is doing better, though he complains of some residual daytime sleepiness.  He has excellent compliance.  He is wondering if he can get a new machine, his machine is very old and is malfunctioning. - Will order new machine, if necessary we may have to repeat home sleep test, but we will see if we can get him a new device without having to order a new sleep study.  Insomnia, short sleep. - Patient's residual daytime sleepiness may be secondary to short sleep, he is only getting about 4 to 5 hours of sleep per night. -He has sleep maintenance insomnia, he wakes up at 3 AM and is not able to sleep further.  Asked him to see if he can go to bed earlier to try to increase his total amount of sleep time. -If not we may consider stimulant medication at next follow-up versus medication to help him sleep longer.  Sleep-related hypoxemia. - Patient needs to requalify for oxygen, will check a overnight oximetry on CPAP without oxygen.  Paroxysmal atrial fibrillation. -Sleep apnea may contribute to atrial fibrillation, therefore is important to continue to monitor this.  Dyspnea. -Improved, encourage weight loss.  Rhinitis. -Continue Flonase 2 sprays in each nostril at bedtime.   Date: 04/28/2018  MRN# [id number] Randy Schneider Nov 26, 1965   Randy Schneider is a 52 y.o. old male seen in follow up for chief complaint of  Chief Complaint  Patient presents with  . Sleep Apnea    pt here to f/u on cpap: pt ready to have orders placed for new cpap  . Shortness of Breath    with exertion: pt needs 02 recert.     HPI:  The patient is a 52 year old male with dyspnea on exertion of uncertain etiology. He has a history of atrial fibrillation, sleep apnea, rhinitis.   He has benefited from the CPAP, he is using it every night, for the entire night, he is no longer snoring at night. He  does have some mild residual sleepiness, which has not been completely resolved with the CPAP.  He goes to bed at 9:30 falls asleep by 10, wakes at 3am spontaneously, then will remain awake until alarm at 4 am. He will remain tired during the day. He uses oxygen at night with CPAP (set at 2, but he turns it up to 3L). He does not nap during the day.   He notes that his CPAP machine is 53 years old, occasionally has some issues with, and is requesting a new device.  We will send patient for a new home sleep test, also needs to requalify for oxygen, will therefore see what his oxygen level is on a home sleep study.  **Review of download data 03/22/2018-04/28/2018>> raw data personally reviewed.  Average usage on days used is 6 hours 55 minutes.  Set pressure is 11.  Residual AHI is 1.7.  Overall this shows excellent compliance with CPAP with excellent control of obstructive sleep apnea. Review of download dated 9/12-10/11/18: Usage greater than 4 hours is 83%, average usage on days used to 6 hours 17 minutes, set pressures. 11. Residual AHI is 1.6. Leaks are minimal.  **Echocardiogram 09/06/2015>> EF is 65%. No reported pulmonary hypertension. **PFT 09/06/2015:>>Reviewed tracings and raw data: FVC was 4.24 L, 81% of predicted. FEV1 was 3.67 L, 88% of predicted. FEV to FVC ratio is  108%. TLC was 76%,  RV/TLC ratio was 65%, inspiratory capacity was 113%. Diffusion capacity was 92%. Overall this test shows moderate reduction Route residual volume with no evidence of primary obstructive or restrictive lung disease. This could be due to body habitus. **Split-night sleep study 11/26/2010>> AHI of 20.  Incomplete titration started on CPAP of 10.  However the study ended at a CPAP of 8 with AHI of 21.  Medication:   Outpatient Encounter Medications as of 04/29/2018  Medication Sig  . aspirin EC 81 MG tablet Take 81 mg by mouth daily.  . colchicine 0.6 MG tablet TAKE 1 TABLET (0.6 MG TOTAL) BY MOUTH 2 (TWO) TIMES  DAILY AS NEEDED.  . febuxostat (ULORIC) 40 MG tablet Take 40 mg by mouth daily.  . flecainide (TAMBOCOR) 50 MG tablet Take 1 tablet (50 mg total) by mouth 2 (two) times daily as needed.  Marland Kitchen FLUZONE QUADRIVALENT 0.5 ML injection Inject 0.5 mLs into the muscle once.  . furosemide (LASIX) 20 MG tablet Take 1 tablet (20 mg total) by mouth as needed (for fullness and/or leg swelling).  Marland Kitchen lisinopril-hydrochlorothiazide (PRINZIDE,ZESTORETIC) 10-12.5 MG tablet TAKE 1 TABLET BY MOUTH DAILY  . NON FORMULARY CPAP DAILY  . NON FORMULARY Oxygen 3 Liters   No facility-administered encounter medications on file as of 04/29/2018.      Allergies:  Codeine; Shrimp [shellfish allergy]; and Sulfa antibiotics  Review of Systems:  Constitutional: Feels well. Cardiovascular: Denies chest pain, exertional chest pain.  Pulmonary: Denies hemoptysis, pleuritic chest pain.   The remainder of systems were reviewed and were found to be negative other than what is documented in the HPI.    Physical Examination:   VS: BP 112/76 (BP Location: Left Arm, Cuff Size: Normal)   Pulse 66   Resp 16   Ht 5\' 9"  (1.753 m)   Wt 239 lb 9.6 oz (108.7 kg)   SpO2 100%   BMI 35.38 kg/m   General Appearance: No distress  Neuro:without focal findings, mental status, speech normal, alert and oriented HEENT: PERRLA, EOM intact Pulmonary: No wheezing, No rales  CardiovascularNormal S1,S2.  No m/r/g.  Abdomen: Benign, Soft, non-tender, No masses Renal:  No costovertebral tenderness  GU:  No performed at this time. Endoc: No evident thyromegaly, no signs of acromegaly or Cushing features Skin:   warm, no rashes, no ecchymosis  Extremities: normal, no cyanosis, clubbing.    LABORATORY PANEL:   CBC No results for input(s): WBC, HGB, HCT, PLT in the last 168 hours. ------------------------------------------------------------------------------------------------------------------  Chemistries  No results for input(s): NA, K,  CL, CO2, GLUCOSE, BUN, CREATININE, CALCIUM, MG, AST, ALT, ALKPHOS, BILITOT in the last 168 hours.  Invalid input(s): GFRCGP ------------------------------------------------------------------------------------------------------------------  Cardiac Enzymes No results for input(s): TROPONINI in the last 168 hours. ------------------------------------------------------------  RADIOLOGY:   No results found for this or any previous visit. Results for orders placed during the hospital encounter of 09/06/15  DG Chest 2 View   Narrative CLINICAL DATA:  Nonsmoker, shortness of breath especially with exertion, on CPAP at night; history of CHF.  EXAM: CHEST  2 VIEW  COMPARISON:  PA and lateral chest x-ray of September 29, 2013  FINDINGS: The lungs are well-expanded. There is no focal infiltrate. There is no pleural effusion. The heart and pulmonary vascularity are normal. The mediastinum is normal in width. The bony thorax exhibits no acute abnormality.  IMPRESSION: There is no active cardiopulmonary disease.   Electronically Signed   By: David  [place] M.D.   On: 09/06/2015 13:00    ------------------------------------------------------------------------------------------------------------------  Thank  you for allowing [facility] to assist in the care of your patient. Our recommendations are noted above.  Please contact us if we can be of further service.   Wells Guiles, M.D., F.C.C.P.  Board Certified in Internal Medicine, Pulmonary Medicine, Critical Care Medicine, and Sleep Medicine.  [facility] Office Number: [phone]

## 2018-04-29 ENCOUNTER — Encounter: Payer: Self-pay | Admitting: Internal Medicine

## 2018-04-29 ENCOUNTER — Ambulatory Visit: Payer: BLUE CROSS/BLUE SHIELD | Admitting: Internal Medicine

## 2018-04-29 VITALS — BP 112/76 | HR 66 | Resp 16 | Ht 69.0 in | Wt 239.6 lb

## 2018-04-29 DIAGNOSIS — G4733 Obstructive sleep apnea (adult) (pediatric): Secondary | ICD-10-CM | POA: Diagnosis not present

## 2018-04-29 NOTE — Addendum Note (Signed)
Addended by: Janean Sark on: 04/29/2018 10:13 AM   Modules accepted: Orders

## 2018-04-29 NOTE — Patient Instructions (Addendum)
Your sleep apnea is adequately treated with CPAP. You need to get more sleep by going to bed earlier, try to get AT LEAST 6 hours total sleep per day.   Your overnight oxygen test should be done on CPAP AND OFF OXYGEN so that you can re-qualify for  oxygen.

## 2018-05-20 ENCOUNTER — Encounter: Payer: Self-pay | Admitting: Internal Medicine

## 2018-05-20 DIAGNOSIS — G4733 Obstructive sleep apnea (adult) (pediatric): Secondary | ICD-10-CM | POA: Diagnosis not present

## 2018-05-27 ENCOUNTER — Telehealth: Payer: Self-pay | Admitting: *Deleted

## 2018-05-27 NOTE — Telephone Encounter (Signed)
Called patient with results. Continue cpap with 2 liters 02 bled in. Nothing further needed.

## 2018-06-20 DIAGNOSIS — G4733 Obstructive sleep apnea (adult) (pediatric): Secondary | ICD-10-CM | POA: Diagnosis not present

## 2018-07-20 DIAGNOSIS — G4733 Obstructive sleep apnea (adult) (pediatric): Secondary | ICD-10-CM | POA: Diagnosis not present

## 2018-08-20 DIAGNOSIS — G4733 Obstructive sleep apnea (adult) (pediatric): Secondary | ICD-10-CM | POA: Diagnosis not present

## 2018-08-24 DIAGNOSIS — Z6837 Body mass index (BMI) 37.0-37.9, adult: Secondary | ICD-10-CM | POA: Diagnosis not present

## 2018-08-24 DIAGNOSIS — J069 Acute upper respiratory infection, unspecified: Secondary | ICD-10-CM | POA: Diagnosis not present

## 2018-08-30 DIAGNOSIS — Z6836 Body mass index (BMI) 36.0-36.9, adult: Secondary | ICD-10-CM | POA: Diagnosis not present

## 2018-08-30 DIAGNOSIS — M109 Gout, unspecified: Secondary | ICD-10-CM | POA: Diagnosis not present

## 2018-08-30 DIAGNOSIS — M545 Low back pain: Secondary | ICD-10-CM | POA: Diagnosis not present

## 2018-09-02 DIAGNOSIS — M1A00X0 Idiopathic chronic gout, unspecified site, without tophus (tophi): Secondary | ICD-10-CM | POA: Diagnosis not present

## 2018-09-02 DIAGNOSIS — N183 Chronic kidney disease, stage 3 (moderate): Secondary | ICD-10-CM | POA: Diagnosis not present

## 2018-09-02 DIAGNOSIS — Z79899 Other long term (current) drug therapy: Secondary | ICD-10-CM | POA: Diagnosis not present

## 2018-09-20 DIAGNOSIS — G4733 Obstructive sleep apnea (adult) (pediatric): Secondary | ICD-10-CM | POA: Diagnosis not present

## 2018-10-07 DIAGNOSIS — Z0001 Encounter for general adult medical examination with abnormal findings: Secondary | ICD-10-CM | POA: Diagnosis not present

## 2018-10-07 DIAGNOSIS — Z13220 Encounter for screening for lipoid disorders: Secondary | ICD-10-CM | POA: Diagnosis not present

## 2018-10-07 DIAGNOSIS — Z125 Encounter for screening for malignant neoplasm of prostate: Secondary | ICD-10-CM | POA: Diagnosis not present

## 2018-10-07 DIAGNOSIS — I10 Essential (primary) hypertension: Secondary | ICD-10-CM | POA: Diagnosis not present

## 2018-10-07 DIAGNOSIS — G4733 Obstructive sleep apnea (adult) (pediatric): Secondary | ICD-10-CM | POA: Diagnosis not present

## 2018-10-07 DIAGNOSIS — Z1331 Encounter for screening for depression: Secondary | ICD-10-CM | POA: Diagnosis not present

## 2018-10-07 DIAGNOSIS — R739 Hyperglycemia, unspecified: Secondary | ICD-10-CM | POA: Diagnosis not present

## 2018-10-19 DIAGNOSIS — G4733 Obstructive sleep apnea (adult) (pediatric): Secondary | ICD-10-CM | POA: Diagnosis not present

## 2018-10-19 DIAGNOSIS — Z6836 Body mass index (BMI) 36.0-36.9, adult: Secondary | ICD-10-CM | POA: Diagnosis not present

## 2018-10-19 DIAGNOSIS — M25541 Pain in joints of right hand: Secondary | ICD-10-CM | POA: Diagnosis not present

## 2018-11-16 DIAGNOSIS — Z79899 Other long term (current) drug therapy: Secondary | ICD-10-CM | POA: Diagnosis not present

## 2018-11-16 DIAGNOSIS — N183 Chronic kidney disease, stage 3 (moderate): Secondary | ICD-10-CM | POA: Diagnosis not present

## 2018-11-16 DIAGNOSIS — M109 Gout, unspecified: Secondary | ICD-10-CM | POA: Diagnosis not present

## 2018-11-16 DIAGNOSIS — I10 Essential (primary) hypertension: Secondary | ICD-10-CM | POA: Diagnosis not present

## 2018-11-16 DIAGNOSIS — M549 Dorsalgia, unspecified: Secondary | ICD-10-CM | POA: Diagnosis not present

## 2018-11-28 DIAGNOSIS — N183 Chronic kidney disease, stage 3 (moderate): Secondary | ICD-10-CM | POA: Diagnosis not present

## 2018-11-28 DIAGNOSIS — M1A00X0 Idiopathic chronic gout, unspecified site, without tophus (tophi): Secondary | ICD-10-CM | POA: Diagnosis not present

## 2018-11-28 DIAGNOSIS — Z79899 Other long term (current) drug therapy: Secondary | ICD-10-CM | POA: Diagnosis not present

## 2018-12-09 ENCOUNTER — Telehealth: Payer: Self-pay

## 2018-12-09 NOTE — Telephone Encounter (Signed)
Spoke with patient to schedule overdue F/U with Dr. Mariah Milling, Patient states he is doing well but has gout right now. Declined telephone or video visit stating he prefers to F/U later in the year. Advised patient that he will be placed on the 03/04/2019 recall list.

## 2018-12-30 DIAGNOSIS — Z79899 Other long term (current) drug therapy: Secondary | ICD-10-CM | POA: Diagnosis not present

## 2018-12-30 DIAGNOSIS — M1A00X0 Idiopathic chronic gout, unspecified site, without tophus (tophi): Secondary | ICD-10-CM | POA: Diagnosis not present

## 2019-01-05 ENCOUNTER — Telehealth: Payer: Self-pay | Admitting: Cardiovascular Disease

## 2019-01-05 NOTE — Telephone Encounter (Signed)
Pending

## 2019-01-11 NOTE — Progress Notes (Signed)
Virtual Visit via Video Note   This visit type was conducted due to national recommendations for restrictions regarding the COVID-19 Pandemic (e.g. social distancing) in an effort to limit this patient's exposure and mitigate transmission in our community.  Due to his co-morbid illnesses, this patient is at least at moderate risk for complications without adequate follow up.  This format is felt to be most appropriate for this patient at this time.  All issues noted in this document were discussed and addressed.  A limited physical exam was performed with this format.  Please refer to the patient's chart for his consent to telehealth for [facility].   I connected with  Randy Limoger D Fregeau on 01/12/19 by a video enabled telemedicine application and verified that I am speaking with the correct person using two identifiers. I discussed the limitations of evaluation and management by telemedicine. The patient expressed understanding and agreed to proceed.   Evaluation Performed:  Follow-up visit  Date:  01/12/2019   ID:  Randy Schneider, DOB 05-09-1966, MRN [id number]  Patient Location:  [address] [place] [address]   Provider location:   [facility], [place] office  PCP:  Lonie Peakonroy, Nathan, PA-C  Cardiologist:  Fonnie MuGollan, [facility]   Chief Complaint:  Chest pain, atrial fib   History of Present Illness:    Randy LimRoger D Hermida is a 53 y.o. male who presents via audio/video conferencing for a telehealth visit today.   The patient does not symptoms concerning for COVID-19 infection (fever, chills, cough, or new SHORTNESS OF BREATH).   Patient has a past medical history of paroxysmal atrial fibrillation,  chronic chest pain,  chronic neck pain ,  depression,  obstructive sleep apnea who wear CPAP,  migraines,  shortness of breath starting back >5 years ago  cardiac catheterization in March 2013 showing no significant coronary artery disease. [facility]. Chronic fatigue, sleep apnea, wears his CPAP who presents for routine of his shortness of breath, atrial fibrillation.   Some chest pain at rest, atypical in nature Sharp pain, last a second Better with aspirin  Rare episodes of atrial fib  Weight down from 240 to 220 Changed diet  CR 1.5, 2 weeks ago  Rare leg edema, takes Lasix when necessary Reports that he is standing on concrete for 9 hours per day No regular exercise program No smoking history, no diabetes,   Other hx reviewed One episode of atrial fibrillation approximately 6 to 8 months ago Sleeping the couch , wife for him to get into bed, startled him, went into atrial  Symptoms lasted 30 minutes, took flecainide 2.  Compliant with his CPAP Feels that it is not working well Feels pressure are not right, not sleeping as well Previously seen by pulmonary 09/2015  trip to the emergency room for chest pain on 01/03/2014. Workup was normal apart from sinus bradycardia. Cardiac enzymes negative  Post cardiac catheterization he developed a migraine, noted to have atrial fibrillation with heart rate 110 beats per minute up to 150 beats per minute. He was started on metoprolol tartrate 50 mg twice a day   Prior symptoms possibly from paroxysmal atrial fibrillation Previous shortness of breath with exertion, chest pain with exertion, periods of left arm pain, numbness in his hand.  Occasionally had to go home from work secondary to symptoms Records indicate stress testing in October 2010 showing no ischemia. This was a Animatornuclear Myoview. He had repeat  nuclear Myoview in 2012 in [place] again showing no ischemia. Images were also reviewed by myself with the patient. They appeared normal. Repeat echo stress test at outside office may 2013 that showed no evidence of ischemia. Otherwise echocardiogram is normal  Echocardiogram October 2010 showing essentially normal echocardiogram with normal ejection fraction.  No mention of dilated right ventricle right atrium  Reports that he was told by outside cardiologist that his right atrium and right ventricle were dilated, and that he needed oxygen. He reports that he has oxygen at home.   Prior CV studies:   The following studies were reviewed today:    Past Medical History:  Diagnosis Date  . A-fib ([place])   . Acute gastritis with hemorrhage   . Cardiomegaly   . Cervical spondylosis without myelopathy   . CHF (congestive heart failure) ([place])   . Depression   . Hiatal hernia   . Hypertension   . Impotence of organic origin   . Leaky heart valve   . Lumbago   . Migraine without aura, without mention of intractable migraine without mention of status migrainosus   . Pure hyperglyceridemia   . Sleep apnea    Past Surgical History:  Procedure Laterality Date  . BACK SURGERY  1999  . CARPAL TUNNEL RELEASE Right 1994  . NASAL SINUS SURGERY  2012  . ROTATOR CUFF REPAIR Left 2003     No outpatient medications have been marked as taking for the 01/12/19 encounter (Telemedicine) with Minna Merritts, MD.     Allergies:   Codeine, Shrimp [shellfish allergy], and Sulfa antibiotics   Social History   Tobacco Use  . Smoking status: Never Smoker  . Smokeless tobacco: Never Used  Substance Use Topics  . Alcohol use: Yes    Comment: occasional  . Drug use: No     Current Outpatient Medications on File Prior to Visit  Medication Sig Dispense Refill  . aspirin EC 81 MG tablet Take 81 mg by mouth daily.    . colchicine 0.6 MG tablet TAKE 1 TABLET (0.6 MG TOTAL) BY MOUTH 2 (TWO) TIMES DAILY AS NEEDED. 60 tablet 1  . febuxostat (ULORIC) 40 MG tablet Take 40 mg by mouth daily.    . flecainide (TAMBOCOR) 50 MG tablet Take 1 tablet (50 mg total) by mouth 2 (two) times daily as needed. 60 tablet 5  . FLUZONE QUADRIVALENT 0.5 ML injection Inject 0.5 mLs into the muscle once.  0  . furosemide (LASIX) 20 MG tablet Take 1 tablet (20 mg total) by mouth  as needed (for fullness and/or leg swelling). 30 tablet 4  . lisinopril-hydrochlorothiazide (PRINZIDE,ZESTORETIC) 10-12.5 MG tablet TAKE 1 TABLET BY MOUTH DAILY 30 tablet 3  . NON FORMULARY CPAP DAILY    . NON FORMULARY Oxygen 3 Liters     No current facility-administered medications on file prior to visit.      Family Hx: The patient's family history includes Arrhythmia in his brother and sister; Heart attack in his father and mother; Heart disease in his brother; Heart failure in his father; Leukemia in his mother.  ROS:   Please see the history of present illness.    Review of Systems  Constitutional: Negative.   HENT: Negative.   Respiratory: Negative.   Cardiovascular: Negative.   Gastrointestinal: Negative.   Musculoskeletal: Negative.   Neurological: Negative.   Psychiatric/Behavioral: Negative.   All other systems reviewed and are negative.    Labs/Other Tests and Data Reviewed:  Recent Labs: 02/11/2018: Creatinine, Ser 1.40   Recent Lipid Panel No results found for: CHOL, TRIG, HDL, CHOLHDL, LDLCALC, LDLDIRECT  Wt Readings from Last 3 Encounters:  04/29/18 239 lb 9.6 oz (108.7 kg)  05/14/17 241 lb (109.3 kg)  04/02/17 239 lb 8 oz (108.6 kg)     Exam:    Vital Signs: Vital signs may also be detailed in the HPI There were no vitals taken for this visit.  Wt Readings from Last 3 Encounters:  04/29/18 239 lb 9.6 oz (108.7 kg)  05/14/17 241 lb (109.3 kg)  04/02/17 239 lb 8 oz (108.6 kg)   Temp Readings from Last 3 Encounters:  No data found for Temp   BP Readings from Last 3 Encounters:  04/29/18 112/76  05/14/17 138/90  04/02/17 140/86   Pulse Readings from Last 3 Encounters:  04/29/18 66  05/14/17 81  04/02/17 61    127/75, 75 resp 16  Well nourished, well developed male in no acute distress. Constitutional:  oriented to person, place, and time. No distress.    ASSESSMENT & PLAN:    Paroxysmal atrial fibrillation (HCC) -  Rare episodes,  continue flecainide as needed CHADS VASC low  Essential hypertension, benign - We will hold lisinopril HCTZ, start lisinopril 10 mg daily for CRI BP better after 20 lb pound weight loss  Shortness of breath -  Denies significant SOB No further workup  Chest pain, unspecified type -  Atypical, no further workup at this time  OSA on CPAP On CPAP  CRI CR of 1.5 Avoid NSIAD, ACE for prevention He has follow up with nephrology   COVID-19 Education: The signs and symptoms of COVID-19 were discussed with the patient and how to seek care for testing (follow up with PCP or arrange E-visit).  The importance of social distancing was discussed today.  Patient Risk:   After full review of this patients clinical status, I feel that they are at least moderate risk at this time.  Time:   Today, I have spent 25 minutes with the patient with telehealth technology discussing the cardiac and medical problems/diagnoses detailed above   10 min spent reviewing the chart prior to patient visit today   Medication Adjustments/Labs and Tests Ordered: Current medicines are reviewed at length with the patient today.  Concerns regarding medicines are outlined above.   Tests Ordered: No tests ordered   Medication Changes: No changes made   Disposition: Follow-up in 12 months   Signed, Julien Nordmannimothy , MD  01/12/2019 11:28 AM    [facility] [place] Office [address], [place], [place] [address]

## 2019-01-12 ENCOUNTER — Telehealth (INDEPENDENT_AMBULATORY_CARE_PROVIDER_SITE_OTHER): Payer: BC Managed Care – PPO | Admitting: Cardiovascular Disease

## 2019-01-12 ENCOUNTER — Other Ambulatory Visit: Payer: Self-pay

## 2019-01-12 DIAGNOSIS — R0602 Shortness of breath: Secondary | ICD-10-CM

## 2019-01-12 DIAGNOSIS — R079 Chest pain, unspecified: Secondary | ICD-10-CM | POA: Diagnosis not present

## 2019-01-12 DIAGNOSIS — G4733 Obstructive sleep apnea (adult) (pediatric): Secondary | ICD-10-CM | POA: Diagnosis not present

## 2019-01-12 DIAGNOSIS — I10 Essential (primary) hypertension: Secondary | ICD-10-CM | POA: Diagnosis not present

## 2019-01-12 DIAGNOSIS — I480 Paroxysmal atrial fibrillation: Secondary | ICD-10-CM | POA: Diagnosis not present

## 2019-01-12 DIAGNOSIS — Z9989 Dependence on other enabling machines and devices: Secondary | ICD-10-CM

## 2019-01-12 MED ORDER — LISINOPRIL 10 MG PO TABS: 10.0000 mg | ORAL_TABLET | Freq: Every day | ORAL | 3 refills | Status: DC

## 2019-01-12 MED ORDER — FLECAINIDE ACETATE 50 MG PO TABS: 50.0000 mg | ORAL_TABLET | Freq: Two times a day (BID) | ORAL | 5 refills | Status: DC | PRN

## 2019-01-12 MED ORDER — FUROSEMIDE 20 MG PO TABS: 20.0000 mg | ORAL_TABLET | ORAL | 4 refills | Status: DC | PRN

## 2019-01-12 NOTE — Patient Instructions (Addendum)
Medication Instructions:  Your physician has recommended you make the following change in your medication:  1. STOP Lisinopril-hydrochlorothiazide 2. START Lisinopril 10 mg once daily 3. Other refills sent to [facility] in [place].   If you need a refill on your cardiac medications before your next appointment, please call your pharmacy.    Lab work: No new labs needed   If you have labs (blood work) drawn today and your tests are completely normal, you will receive your results only by: Marland Kitchen MyChart Message (if you have MyChart) OR . A paper copy in the mail If you have any lab test that is abnormal or we need to change your treatment, we will call you to review the results.   Testing/Procedures: No new testing needed   Follow-Up: At [facility], you and your health needs are our priority.  As part of our continuing mission to provide you with exceptional heart care, we have created designated Provider Care Teams.  These Care Teams include your primary Cardiologist (physician) and Advanced Practice Providers (APPs -  Physician Assistants and Nurse Practitioners) who all work together to provide you with the care you need, when you need it.  . You will need a follow up appointment in 12 months .   Please call our office 2 months in advance to schedule this appointment.    . Providers on your designated Care Team:   . Murray Hodgkins, NP . Christell Faith, PA-C . Marrianne Mood, PA-C  Any Other Special Instructions Will Be Listed Below (If Applicable).  For educational health videos Log in to : [email or web address] Or : [email or web address], password : [email or web address]

## 2019-01-31 DIAGNOSIS — G4733 Obstructive sleep apnea (adult) (pediatric): Secondary | ICD-10-CM | POA: Diagnosis not present

## 2019-02-09 DIAGNOSIS — M7122 Synovial cyst of popliteal space [Baker], left knee: Secondary | ICD-10-CM | POA: Diagnosis not present

## 2019-02-09 DIAGNOSIS — Z6834 Body mass index (BMI) 34.0-34.9, adult: Secondary | ICD-10-CM | POA: Diagnosis not present

## 2019-02-10 DIAGNOSIS — M25562 Pain in left knee: Secondary | ICD-10-CM | POA: Diagnosis not present

## 2019-02-10 DIAGNOSIS — M25462 Effusion, left knee: Secondary | ICD-10-CM | POA: Diagnosis not present

## 2019-02-17 DIAGNOSIS — Z79899 Other long term (current) drug therapy: Secondary | ICD-10-CM | POA: Diagnosis not present

## 2019-02-17 DIAGNOSIS — N183 Chronic kidney disease, stage 3 (moderate): Secondary | ICD-10-CM | POA: Diagnosis not present

## 2019-02-17 DIAGNOSIS — M1A00X0 Idiopathic chronic gout, unspecified site, without tophus (tophi): Secondary | ICD-10-CM | POA: Diagnosis not present

## 2019-02-17 DIAGNOSIS — M25562 Pain in left knee: Secondary | ICD-10-CM | POA: Diagnosis not present

## 2019-03-14 DIAGNOSIS — N183 Chronic kidney disease, stage 3 (moderate): Secondary | ICD-10-CM | POA: Diagnosis not present

## 2019-03-14 DIAGNOSIS — G4733 Obstructive sleep apnea (adult) (pediatric): Secondary | ICD-10-CM | POA: Diagnosis not present

## 2019-03-14 DIAGNOSIS — I4891 Unspecified atrial fibrillation: Secondary | ICD-10-CM | POA: Diagnosis not present

## 2019-03-14 DIAGNOSIS — M109 Gout, unspecified: Secondary | ICD-10-CM | POA: Diagnosis not present

## 2019-04-07 ENCOUNTER — Other Ambulatory Visit: Payer: Self-pay | Admitting: Cardiovascular Disease

## 2019-04-07 DIAGNOSIS — N281 Cyst of kidney, acquired: Secondary | ICD-10-CM | POA: Diagnosis not present

## 2019-04-07 DIAGNOSIS — G4733 Obstructive sleep apnea (adult) (pediatric): Secondary | ICD-10-CM | POA: Diagnosis not present

## 2019-04-07 DIAGNOSIS — N189 Chronic kidney disease, unspecified: Secondary | ICD-10-CM | POA: Diagnosis not present

## 2019-04-07 DIAGNOSIS — M109 Gout, unspecified: Secondary | ICD-10-CM | POA: Diagnosis not present

## 2019-04-07 DIAGNOSIS — N183 Chronic kidney disease, stage 3 (moderate): Secondary | ICD-10-CM | POA: Diagnosis not present

## 2019-04-07 DIAGNOSIS — I4891 Unspecified atrial fibrillation: Secondary | ICD-10-CM | POA: Diagnosis not present

## 2019-04-14 DIAGNOSIS — E785 Hyperlipidemia, unspecified: Secondary | ICD-10-CM | POA: Diagnosis not present

## 2019-04-14 DIAGNOSIS — M109 Gout, unspecified: Secondary | ICD-10-CM | POA: Diagnosis not present

## 2019-04-14 DIAGNOSIS — N183 Chronic kidney disease, stage 3 (moderate): Secondary | ICD-10-CM | POA: Diagnosis not present

## 2019-04-14 DIAGNOSIS — I10 Essential (primary) hypertension: Secondary | ICD-10-CM | POA: Diagnosis not present

## 2019-04-14 DIAGNOSIS — Z23 Encounter for immunization: Secondary | ICD-10-CM | POA: Diagnosis not present

## 2019-04-14 DIAGNOSIS — I4891 Unspecified atrial fibrillation: Secondary | ICD-10-CM | POA: Diagnosis not present

## 2019-06-01 DIAGNOSIS — G4733 Obstructive sleep apnea (adult) (pediatric): Secondary | ICD-10-CM | POA: Diagnosis not present

## 2019-06-01 DIAGNOSIS — N1831 Chronic kidney disease, stage 3a: Secondary | ICD-10-CM | POA: Diagnosis not present

## 2019-06-01 DIAGNOSIS — I4891 Unspecified atrial fibrillation: Secondary | ICD-10-CM | POA: Diagnosis not present

## 2019-06-01 DIAGNOSIS — I10 Essential (primary) hypertension: Secondary | ICD-10-CM | POA: Diagnosis not present

## 2019-07-22 ENCOUNTER — Other Ambulatory Visit: Payer: Self-pay | Admitting: Cardiovascular Disease

## 2019-08-25 DIAGNOSIS — R7301 Impaired fasting glucose: Secondary | ICD-10-CM | POA: Diagnosis not present

## 2019-08-25 DIAGNOSIS — I4891 Unspecified atrial fibrillation: Secondary | ICD-10-CM | POA: Diagnosis not present

## 2019-08-25 DIAGNOSIS — M109 Gout, unspecified: Secondary | ICD-10-CM | POA: Diagnosis not present

## 2019-08-25 DIAGNOSIS — N183 Chronic kidney disease, stage 3 unspecified: Secondary | ICD-10-CM | POA: Diagnosis not present

## 2019-08-25 DIAGNOSIS — I10 Essential (primary) hypertension: Secondary | ICD-10-CM | POA: Diagnosis not present

## 2019-10-13 DIAGNOSIS — G4733 Obstructive sleep apnea (adult) (pediatric): Secondary | ICD-10-CM | POA: Diagnosis not present

## 2019-10-13 DIAGNOSIS — I10 Essential (primary) hypertension: Secondary | ICD-10-CM | POA: Diagnosis not present

## 2019-10-13 DIAGNOSIS — Z125 Encounter for screening for malignant neoplasm of prostate: Secondary | ICD-10-CM | POA: Diagnosis not present

## 2019-10-13 DIAGNOSIS — I4891 Unspecified atrial fibrillation: Secondary | ICD-10-CM | POA: Diagnosis not present

## 2019-10-13 DIAGNOSIS — Z13220 Encounter for screening for lipoid disorders: Secondary | ICD-10-CM | POA: Diagnosis not present

## 2019-10-13 DIAGNOSIS — Z0001 Encounter for general adult medical examination with abnormal findings: Secondary | ICD-10-CM | POA: Diagnosis not present

## 2019-11-10 ENCOUNTER — Ambulatory Visit (INDEPENDENT_AMBULATORY_CARE_PROVIDER_SITE_OTHER): Payer: BC Managed Care – PPO

## 2019-11-10 ENCOUNTER — Other Ambulatory Visit: Payer: Self-pay

## 2019-11-10 ENCOUNTER — Ambulatory Visit: Payer: BC Managed Care – PPO | Admitting: Family

## 2019-11-10 ENCOUNTER — Encounter: Payer: Self-pay | Admitting: Family

## 2019-11-10 ENCOUNTER — Other Ambulatory Visit
Admission: RE | Admit: 2019-11-10 | Discharge: 2019-11-10 | Disposition: A | Discharge status: Home / Self Care | Payer: BC Managed Care – PPO | Source: Ambulatory Visit | Attending: Family | Admitting: Family

## 2019-11-10 VITALS — BP 132/82 | HR 85 | Ht 69.0 in | Wt 237.0 lb

## 2019-11-10 DIAGNOSIS — G4733 Obstructive sleep apnea (adult) (pediatric): Secondary | ICD-10-CM | POA: Diagnosis not present

## 2019-11-10 DIAGNOSIS — Z79899 Other long term (current) drug therapy: Secondary | ICD-10-CM | POA: Diagnosis not present

## 2019-11-10 DIAGNOSIS — R42 Dizziness and giddiness: Secondary | ICD-10-CM

## 2019-11-10 DIAGNOSIS — R55 Syncope and collapse: Secondary | ICD-10-CM | POA: Diagnosis not present

## 2019-11-10 DIAGNOSIS — Z9989 Dependence on other enabling machines and devices: Secondary | ICD-10-CM

## 2019-11-10 DIAGNOSIS — I480 Paroxysmal atrial fibrillation: Secondary | ICD-10-CM | POA: Diagnosis not present

## 2019-11-10 DIAGNOSIS — R079 Chest pain, unspecified: Secondary | ICD-10-CM

## 2019-11-10 DIAGNOSIS — I10 Essential (primary) hypertension: Secondary | ICD-10-CM

## 2019-11-10 DIAGNOSIS — R0602 Shortness of breath: Secondary | ICD-10-CM

## 2019-11-10 DIAGNOSIS — E782 Mixed hyperlipidemia: Secondary | ICD-10-CM

## 2019-11-10 LAB — TROPONIN I (HIGH SENSITIVITY): Troponin I (High Sensitivity): 4 ng/L (ref <18)

## 2019-11-10 NOTE — Patient Instructions (Signed)
Medication Instructions:  Your physician recommends that you continue on your current medications as directed. Please refer to the Current Medication list given to you today.  *If you need a refill on your cardiac medications before your next appointment, please call your pharmacy*   Lab Work: STAT Troponin  Please have your lab drawn today at the [facility] mall.  If you have labs (blood work) drawn today and your tests are completely normal, you will receive your results only by: Marland Kitchen MyChart Message (if you have MyChart) OR . A paper copy in the mail If you have any lab test that is abnormal or we need to change your treatment, we will call you to review the results.   Testing/Procedures: Your physician has recommended that you wear an live zio monitor. Live zio monitors are medical devices that record the heart's electrical activity. Doctors most often [place] these monitors to diagnose arrhythmias. Arrhythmias are problems with the speed or rhythm of the heartbeat. The monitor is a small, portable device. You can wear one while you do your normal daily activities. This is usually used to diagnose what is causing palpitations/syncope (passing out). (To be worn for 14 days)     Follow-Up: At [facility], you and your health needs are our priority.  As part of our continuing mission to provide you with exceptional heart care, we have created designated Provider Care Teams.  These Care Teams include your primary Cardiologist (physician) and Advanced Practice Providers (APPs -  Physician Assistants and Nurse Practitioners) who all work together to provide you with the care you need, when you need it.  We recommend signing up for the patient portal called "MyChart".  Sign up information is provided on this After Visit Summary.  MyChart is used to connect with patients for Virtual Visits (Telemedicine).  Patients are able to view lab/test results, encounter notes, upcoming appointments, etc.   Non-urgent messages can be sent to your provider as well.   To learn more about what you can do with MyChart, go to [email or web address].    Your next appointment:   3-4 week(s)  The format for your next appointment:   In Person  Provider:    You may see Dr. Mariah Milling or one of the following Advanced Practice Providers on your designated Care Team:    Nicolasa Ducking, NP  Eula Listen, PA-C  Marisue Ivan, PA-C    Other Instructions N/A

## 2019-11-10 NOTE — Progress Notes (Signed)
Office Visit    Patient Name: Randy Schneider Date of Encounter: 11/10/2019  Primary Care Provider:  Cyndi Bender, PA-C Primary Cardiologist:  Ida Rogue, MD Electrophysiologist:  None   Chief Complaint    Randy Schneider is a 54 y.o. male with a hx of PAF, chronic chest pain, HTN, chronic neck pain, depression, OSA on CPAP, CKDIII, migraines, SOB presents today for chest pain, near-syncope   Past Medical History    Past Medical History:  Diagnosis Date  . A-fib ([place])   . Acute gastritis with hemorrhage   . Cardiomegaly   . Cervical spondylosis without myelopathy   . CHF (congestive heart failure) ([place])   . Depression   . Hiatal hernia   . Hypertension   . Impotence of organic origin   . Leaky heart valve   . Lumbago   . Migraine without aura, without mention of intractable migraine without mention of status migrainosus   . Pure hyperglyceridemia   . Sleep apnea    Past Surgical History:  Procedure Laterality Date  . BACK SURGERY  1999  . CARPAL TUNNEL RELEASE Right 1994  . NASAL SINUS SURGERY  2012  . ROTATOR CUFF REPAIR Left 2003    Allergies  Allergies  Allergen Reactions  . Codeine Other (See Comments)    Nervous   . Shrimp [Shellfish Allergy]   . Sulfa Antibiotics Rash    History of Present Illness    Randy Schneider is a 54 y.o. male with a hx of PAF, chronic chest pain, HTN, chronic neck pain, depression, OSA on CPAP, CKDIII, migraines, SOB last seen 01/12/19 via telemedicine by Dr. Rockey Situ  PAF, chronic chest pain, HTN, chronic neck pain, depression, OSA on CPAP, CKDIII, migraines, SOB  Cardiac cath 2013 with no significant coronary artery disease. Post cath had migrain, noted atrial fib with HR 110 bpm up to 150bpm. Started on metoprolol. This was discontinued at unclear date.   Had episode of chest pain yesterday about 1:30pm while up moving around at work. Felt like pressure in the left side of his chest. Took a flecainide as he wasn't sure if  that was atrial fibrillation. Felt different than previous atrial fibrillation - no "racing heart beat" sensation.  Eased off after about an hour. Associated with lightheadedness and near syncope. Did get clammy. Was also short of breath.   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 BP at work was 130/80.   Has been using flecainide once per week. Normally works within 30 minutes.   When he bends over gets lightheaded which has been going on for about a week.   Last Wednesday morning woke up "not breathing" despite wearing his CPAP and oxygen.  Has not been seen by pulmonology in >1 year. Requests new referral to see.   Tells me he was on a cholesterol medicine in the past but it started making his muscles ache. Discussed LDL goal <100.   Labs with PCP 10/13/19:  Total cholesterol 216, triglycerides 136, HDL 35, LDL 156  Creatinine 1.57, GFR 49, K 5.1, AST 17, ALT 20  TSH 2.93  Hb 14.8   EKGs/Labs/Other Studies Reviewed:   The following studies were reviewed today:  Echo 09/2015 Left ventricle: The cavity size was normal. Systolic function was    normal. The estimated ejection fraction was in the range of 60%    to 65%. Wall motion was normal; there were  no regional wall    motion abnormalities. Doppler parameters are consistent with    abnormal left ventricular relaxation (grade 1 diastolic    dysfunction).  - Mitral valve: There was mild regurgitation.  - Left atrium: The atrium was normal in size.  - Right ventricle: Systolic function was normal.  - Pulmonary arteries: Systolic pressure was within the normal    range.   Cardiac cath 10/2013 Normal coronaries. No coronary artery disease.   EKG:  EKG is ordered today.  The ekg ordered today demonstrates SR 85 bpm with no acute ST/T wave changes.   Recent Labs: No results found for requested labs within last 8760 hours.  Recent Lipid Panel No results found for:  CHOL, TRIG, HDL, CHOLHDL, VLDL, LDLCALC, LDLDIRECT  Home Medications   Current Meds  Medication Sig  . allopurinol (ZYLOPRIM) 100 MG tablet Take 100 mg by mouth daily.   Marland Kitchen allopurinol (ZYLOPRIM) 300 MG tablet Take 300 mg by mouth daily.   Marland Kitchen aspirin EC 81 MG tablet Take 81 mg by mouth daily.  . colchicine 0.6 MG tablet TAKE 1 TABLET (0.6 MG TOTAL) BY MOUTH 2 (TWO) TIMES DAILY AS NEEDED.  . flecainide (TAMBOCOR) 50 MG tablet TAKE 1 TABLET (50 MG TOTAL) BY MOUTH 2 (TWO) TIMES DAILY AS NEEDED.  Marland Kitchen FLUZONE QUADRIVALENT 0.5 ML injection Inject 0.5 mLs into the muscle once.  . furosemide (LASIX) 20 MG tablet TAKE 1 TABLET (20 MG TOTAL) BY MOUTH AS NEEDED (FOR FULLNESS AND/OR LEG SWELLING).  Marland Kitchen lisinopril-hydrochlorothiazide (ZESTORETIC) 10-12.5 MG tablet Take by mouth.  . NON FORMULARY CPAP DAILY  . NON FORMULARY Oxygen 3 Liters      Review of Systems      Review of Systems  Constitution: Negative for chills, fever and malaise/fatigue.  Cardiovascular: Positive for chest pain, near-syncope and palpitations. Negative for dyspnea on exertion, leg swelling, orthopnea and syncope.       (+) diaphoresis  Respiratory: Positive for shortness of breath. Negative for cough and wheezing.   Gastrointestinal: Negative for nausea and vomiting.  Neurological: Positive for light-headedness. Negative for dizziness and weakness.  Psychiatric/Behavioral: The patient is nervous/anxious.    All other systems reviewed and are otherwise negative except as noted above.  Physical Exam    VS:  BP 132/82 (BP Location: Left Arm, Patient Position: Sitting, Cuff Size: Normal)   Pulse 85   Ht 5\' 9"  (1.753 m)   Wt 237 lb (107.5 kg)   SpO2 98%   BMI 35.00 kg/m  , BMI Body mass index is 35 kg/m. GEN: Well nourished, overweight, well developed, in no acute distress. HEENT: normal. Neck: Supple, no JVD, carotid bruits, or masses. Cardiac: RRR, no murmurs, rubs, or gallops. No clubbing, cyanosis, edema.   Radials/DP/PT 2+ and equal bilaterally.  Respiratory:  Respirations regular and unlabored, clear to auscultation bilaterally. GI: Soft, nontender, nondistended, BS + x 4. MS: No deformity or atrophy. Skin: Warm and dry, no rash. Neuro:  Strength and sensation are intact. Psych: Normal affect. Anxious.    Assessment & Plan    1. Chest pain - Normal coronary arteries by cath 2015. No ischemic evaluation since. Reports chest pain yesterday for one hour with activity that resolved after taking Flecainide and resting. Described as "pressure" and "sharp" to left chest. Recurred today. Associated with diaphoresis, near syncope, and shortness of breath.   Etiology atrial fib vs coronary disease vs anxiety.   Stat troponin - subsequently negative. Plan for outpatient Lexiscan Myoview. Risk  factors include HTN, HLD, family history of CAD (MI in mother, father, brother).   2. Near syncope/lightheadedness - Reports episodes of near syncope over the last two days associated with chest pain. Also notes lightheadedness with bending over for the last week. Reports no low BP at home. Concern for conversion pause or recurrence of atrial fibrillation. 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 7 day live telemetry monitor placed in clinic today.   3. PAF - Noted after cardiac catheterization 2015. Has since been managed with Flecainide 50mg  as needed. Not on anticoagulation due to CHADS2VASc of 1 (HTN). Reports using Flecainide more frequently over recent months, approximately once per week. Recent labs by PCP with normal CBC, TSH, electrolytes.  Not on rate limiting agent - previously on Metoprolol but tells me it was discontinued for side effects, can't recall effects. If to continue on regular Flecainide will likely require AV nodal blocking agent.   Concern for conversion pauses due to near syncope, live telemetry monitor placed for 1 week.   4. High risk  medication use - Flecainide secondary to PAF. Ideally needs to be on AVnodal blocking agent such as beta blocker or CCB. Will inquire with primary cardiologist if he recalls patient reaction to previous beta blocker.   5. HTN - Well controlled. Continue Lisinopril-HCTZ 10-12.5mg  daily. Encouraged to check at home and keep a log.  6. CKDIII - Follows with nephrology. Careful titration of antihypertensives and diuretics. BMP 10/13/19 creatinine 1.57, BUN 17, GFR 49.  7. LE edema - Very intermittent. Likely venous insufficiency and dependent edema as he stands all day at work. Well controlled on Lasix 20mg  PRN.  8. HLD - Tells me he was on cholesterol medication in the past and had muscle aches, uncertain which medication he was on. Lipid panel with PCP 10/13/19 total cholesterol 216, HDL 35, LDL 156, triglycerides 136. Likely requires cholesterol medication to get LDL to goal of <100. Will address at follow up. Consider Nexlizet if intolerant of statins.   9. OSA - Reports compliance with CPAP. Does note some orthopnea. His previous pulmonologist is no longer at that location, will send new referral to pulmonology. Poorly controlled OSA could be contributory to recurrent atrial fibrillation.   Disposition: Stat troponin - negative, no indication to go to ED, plan for [facility]. Live telemetry monitor placed due to near syncope and concern for conversion pauses. Follow up in 3 week(s) with Dr. 12/13/19 or APP.   [facility], NP 11/10/2019, 5:30 PM

## 2019-11-13 ENCOUNTER — Telehealth: Payer: Self-pay

## 2019-11-13 DIAGNOSIS — R079 Chest pain, unspecified: Secondary | ICD-10-CM

## 2019-11-13 NOTE — Telephone Encounter (Signed)
-----   Message from Alver Sorrow, NP sent at 11/10/2019  5:21 PM EDT ----- Patient called twice, no answer. Spoke with his wife per DPR. Troponin was normal, no indication for ED evaluation at this time.  Will plan for outpatient stress testing for symptoms of chest pain. Informed his wife we would touch base Monday regarding scheduling.

## 2019-11-13 NOTE — Telephone Encounter (Signed)
Clarified with Gillian Shields, NP the type of stress test needed. Patient will need a lexiscan. Order is in Epic, message fwd to scheduling to schedule.

## 2019-11-13 NOTE — Telephone Encounter (Signed)
Scheduled -   [facility] [employer]

## 2019-11-14 ENCOUNTER — Other Ambulatory Visit: Payer: Self-pay

## 2019-11-14 NOTE — Telephone Encounter (Signed)
DPR on file. Patient wife Harriett Sine given verbal pre-test instructions for the patients upcoming stress test.  Patient is currently wearing a zio- monitor that is to be completed on 11/17/19. Karen Kays that the patient can d/c the monitor on 11/16/19 prior to his stress test, package it up and mail it back.  Harriett Sine verbalized understanding and voiced appreciation for the call.     [facility] MYOVIEW  Your caregiver has ordered a Stress Test with nuclear imaging. The purpose of this test is to evaluate the blood supply to your heart muscle. This procedure is referred to as a "Non-Invasive Stress Test." This is because other than having an IV started in your vein, nothing is inserted or "invades" your body. Cardiac stress tests are done to find areas of poor blood flow to the heart by determining the extent of coronary artery disease (CAD). Some patients exercise on a treadmill, which naturally increases the blood flow to your heart, while others who are  unable to walk on a treadmill due to physical limitations have a pharmacologic/chemical stress agent called Lexiscan . This medicine will mimic walking on a treadmill by temporarily increasing your coronary blood flow.   Please note: these test may take anywhere between 2-4 hours to complete  PLEASE REPORT TO [facility] MEDICAL MALL ENTRANCE  THE VOLUNTEERS AT THE FIRST DESK WILL DIRECT YOU WHERE TO GO  Date of Procedure: 11/16/19  Arrival Time for Procedure: 8:30am  PLEASE NOTIFY THE OFFICE AT LEAST 24 HOURS IN ADVANCE IF YOU ARE UNABLE TO KEEP YOUR APPOINTMENT.  [phone] AND  PLEASE NOTIFY NUCLEAR MEDICINE AT [facility] AT LEAST 24 HOURS IN ADVANCE IF YOU ARE UNABLE TO KEEP YOUR APPOINTMENT. [phone]  How to prepare for your Myoview test:  1. Do not eat or drink after midnight 2. No caffeine for 24 hours prior to test 3. No smoking 24 hours prior to test. 4. Your medication may be taken with water.  If your doctor stopped a medication because of this  test, do not take that medication. 5. Ladies, please do not wear dresses.  Skirts or pants are appropriate. Please wear a short sleeve shirt. 6. No perfume, cologne or lotion. 7. Wear comfortable walking shoes. No heels!

## 2019-11-16 ENCOUNTER — Other Ambulatory Visit: Payer: Self-pay

## 2019-11-16 ENCOUNTER — Ambulatory Visit
Admission: RE | Admit: 2019-11-16 | Discharge: 2019-11-16 | Disposition: A | Discharge status: Home / Self Care | Payer: BC Managed Care – PPO | Source: Ambulatory Visit | Attending: Family | Admitting: Family

## 2019-11-16 DIAGNOSIS — R079 Chest pain, unspecified: Secondary | ICD-10-CM | POA: Diagnosis not present

## 2019-11-16 LAB — NM MYOCAR MULTI W/SPECT W/WALL MOTION / EF
Estimated workload: 1 METS
Exercise duration (min): 0 min
Exercise duration (sec): 0 s
LV dias vol: 100 mL (ref 62–150)
LV sys vol: 33 mL
MPHR: 166 {beats}/min
Peak HR: 103 {beats}/min
Percent HR: 62 %
Rest HR: 56 {beats}/min
SDS: 0
SRS: 0
SSS: 0
TID: 1.19

## 2019-11-16 MED ORDER — TECHNETIUM TC 99M TETROFOSMIN IV KIT
31.5730 | PACK | Freq: Once | INTRAVENOUS | Status: AC | PRN
  Administered 2019-11-16: 10:00:00 31.573 via INTRAVENOUS

## 2019-11-16 MED ORDER — TECHNETIUM TC 99M TETROFOSMIN IV KIT
10.0000 | PACK | Freq: Once | INTRAVENOUS | Status: AC | PRN
  Administered 2019-11-16: 09:00:00 10.77 via INTRAVENOUS

## 2019-11-16 MED ORDER — REGADENOSON 0.4 MG/5ML IV SOLN
0.4000 mg | Freq: Once | INTRAVENOUS | Status: AC
  Administered 2019-11-16: 10:00:00 0.4 mg via INTRAVENOUS

## 2019-11-17 ENCOUNTER — Telehealth: Payer: Self-pay

## 2019-11-17 DIAGNOSIS — R0600 Dyspnea, unspecified: Secondary | ICD-10-CM

## 2019-11-17 NOTE — Telephone Encounter (Signed)
Alver Sorrow, NP  P Cv Div [place] Triage  Preliminary review of his ZIO AT shows primarily sinus rhythm and sinus tachycardia. Both are regular heart rhythms. Will be formally reviewed by MD.   Discussed with Dr. Mariah Milling. Most of patient triggered events on the monitor are associated with sinus tachycardia. Would recommend starting Toprol 25mg  daily. We can assess his response to this medication at upcoming follow up 12/01/19.   Please call patient and inform of recommendations. Of note, he also has Lexiscan results to be called.   12/03/19, NP    Call attempted. No answer, no vm.

## 2019-11-17 NOTE — Telephone Encounter (Signed)
-----   Message from Alver Sorrow, NP sent at 11/17/2019  7:58 AM EDT ----- Stress test with no evidence of blockage or ischemia, good result!  Does show possibly reduced pumping function which could be related to artifact on the study. For definitive reading of pumping function (EF) recommend echocardiogram. Can schedule now or discuss at upcoming appt 4/30.   (Please see additional triage note regarding elevated HR/palpitations & inform him of both)

## 2019-11-20 NOTE — Telephone Encounter (Signed)
Call attempted. NA/NV 

## 2019-11-20 NOTE — Telephone Encounter (Signed)
Patient wife returning call and patient is not available with a signal until 430

## 2019-11-22 MED ORDER — METOPROLOL SUCCINATE ER 25 MG PO TB24: 25.0000 mg | ORAL_TABLET | Freq: Every day | ORAL | 3 refills | Status: DC

## 2019-11-22 NOTE — Telephone Encounter (Signed)
Patient wife is returning the call

## 2019-11-22 NOTE — Telephone Encounter (Signed)
Returned call to wife. Call to patient to review labs.    wife verbalized understanding and has no further questions at this time.    Advised pt to call for any further questions or concerns.  All orders placed. Agreeable to poc.

## 2019-11-23 DIAGNOSIS — N1831 Chronic kidney disease, stage 3a: Secondary | ICD-10-CM | POA: Diagnosis not present

## 2019-11-23 DIAGNOSIS — G4733 Obstructive sleep apnea (adult) (pediatric): Secondary | ICD-10-CM | POA: Diagnosis not present

## 2019-11-23 DIAGNOSIS — I10 Essential (primary) hypertension: Secondary | ICD-10-CM | POA: Diagnosis not present

## 2019-11-23 DIAGNOSIS — I4891 Unspecified atrial fibrillation: Secondary | ICD-10-CM | POA: Diagnosis not present

## 2019-11-27 ENCOUNTER — Other Ambulatory Visit: Payer: Self-pay | Admitting: Cardiovascular Disease

## 2019-11-27 MED ORDER — LISINOPRIL-HYDROCHLOROTHIAZIDE 10-12.5 MG PO TABS: 1.0000 | ORAL_TABLET | Freq: Every day | ORAL | 0 refills | Status: DC

## 2019-11-27 NOTE — Telephone Encounter (Signed)
Requested Prescriptions   Signed Prescriptions Disp Refills   lisinopril-hydrochlorothiazide (ZESTORETIC) 10-12.5 MG tablet 90 tablet 0    Sig: Take 1 tablet by mouth daily.    Authorizing Provider: Alver Sorrow    Ordering User: Thayer Headings, BRANDY L

## 2019-11-27 NOTE — Telephone Encounter (Signed)
*  STAT* If patient is at the pharmacy, call can be transferred to refill team.   1. Which medications need to be refilled? (please list name of each medication and dose if known) lisinopril 10-12.5 mg  2. Which pharmacy/location (including street and city if local pharmacy) is medication to be sent to? [facility] [place]  3. Do they need a 30 day or 90 day supply? 90

## 2019-11-28 ENCOUNTER — Ambulatory Visit (INDEPENDENT_AMBULATORY_CARE_PROVIDER_SITE_OTHER): Payer: BC Managed Care – PPO

## 2019-11-28 ENCOUNTER — Other Ambulatory Visit: Payer: Self-pay

## 2019-11-28 DIAGNOSIS — R0600 Dyspnea, unspecified: Secondary | ICD-10-CM | POA: Diagnosis not present

## 2019-12-01 ENCOUNTER — Encounter: Payer: Self-pay | Admitting: Family

## 2019-12-01 ENCOUNTER — Ambulatory Visit: Payer: BC Managed Care – PPO | Admitting: Family

## 2019-12-01 ENCOUNTER — Other Ambulatory Visit: Payer: Self-pay

## 2019-12-01 VITALS — BP 126/86 | HR 74 | Ht 69.0 in | Wt 237.0 lb

## 2019-12-01 DIAGNOSIS — I77810 Thoracic aortic ectasia: Secondary | ICD-10-CM

## 2019-12-01 DIAGNOSIS — I951 Orthostatic hypotension: Secondary | ICD-10-CM

## 2019-12-01 DIAGNOSIS — I10 Essential (primary) hypertension: Secondary | ICD-10-CM

## 2019-12-01 DIAGNOSIS — R42 Dizziness and giddiness: Secondary | ICD-10-CM

## 2019-12-01 DIAGNOSIS — G4733 Obstructive sleep apnea (adult) (pediatric): Secondary | ICD-10-CM | POA: Diagnosis not present

## 2019-12-01 DIAGNOSIS — Z9989 Dependence on other enabling machines and devices: Secondary | ICD-10-CM

## 2019-12-01 DIAGNOSIS — Z79899 Other long term (current) drug therapy: Secondary | ICD-10-CM

## 2019-12-01 DIAGNOSIS — I480 Paroxysmal atrial fibrillation: Secondary | ICD-10-CM | POA: Diagnosis not present

## 2019-12-01 DIAGNOSIS — I471 Supraventricular tachycardia: Secondary | ICD-10-CM | POA: Diagnosis not present

## 2019-12-01 DIAGNOSIS — E782 Mixed hyperlipidemia: Secondary | ICD-10-CM

## 2019-12-01 NOTE — Progress Notes (Signed)
Office Visit    Patient Name: Randy Schneider Date of Encounter: 12/01/2019  Primary Care Provider:  Cyndi Bender, PA-C Primary Cardiologist:  Ida Rogue, MD Electrophysiologist:  None   Chief Complaint    Randy Schneider is a 54 y.o. male with a hx of PAF, chronic chest pain, HTN, chronic neck pain, depression, OSA on CPAP, CKDIII, migraines, SOB presents today for chest pain, near-syncope   Past Medical History    Past Medical History:  Diagnosis Date  . A-fib ([place])   . Acute gastritis with hemorrhage   . Cardiomegaly   . Cervical spondylosis without myelopathy   . CHF (congestive heart failure) ([place])   . Depression   . Hiatal hernia   . Hypertension   . Impotence of organic origin   . Leaky heart valve   . Lumbago   . Migraine without aura, without mention of intractable migraine without mention of status migrainosus   . Pure hyperglyceridemia   . Sleep apnea    Past Surgical History:  Procedure Laterality Date  . BACK SURGERY  1999  . CARPAL TUNNEL RELEASE Right 1994  . NASAL SINUS SURGERY  2012  . ROTATOR CUFF REPAIR Left 2003    Allergies  Allergies  Allergen Reactions  . Codeine Other (See Comments)    Nervous   . Shrimp [Shellfish Allergy]   . Sulfa Antibiotics Rash    History of Present Illness    Randy Schneider is a 54 y.o. male with a hx of PAF, chronic chest pain, HTN, chronic neck pain, depression, OSA on CPAP, CKDIII, migraines, SOB.  He was last seen 11/10/2019.  Cardiac cath 2013 with no significant coronary artery disease. Post cath had migraine, noted atrial fib with HR 110 bpm up to 150bpm. Started on metoprolol. This was discontinued at unclear date.   At last office visit noted episode of chest pain which felt like pressure in the left side of his chest associated with dyspnea on exertion.  Subsequent Lexiscan Myoview 11/2019 was low risk, normal wall motion, with EF estimated 40%. Subsequent echocardiogram showed LVEF 60-65%, gr1DD,  mild MR, mildly dilated ascending aorta 16mm. Marland Kitchen  He also noted more frequent palpitations and need for as needed flecainide, near syncope with subsequent ZIO monitor with preliminary review by myself showing primarily NSR, 1 episode PSVT lasting 8 seconds at 190 bpm, and patient triggered episodes associated with sinus tachycardia. Marland Kitchen  He was started on Toprol-XL 25 mg.  It was additionally given new referral to pulmonology as he not seen him in greater than 1 year and was waking up at night "not breathing "despite wearing a CPAP.  Tells me he was on a cholesterol medicine in the past but it started making his muscles ache. Discussed LDL goal <100.   Reports some decrease in frequency of palpitations and symptoms since starting Toprol.  Reports no recurrent chest pain, pressure, tightness.  He celebrated his 54th birthday yesterday with a cake made by his wife and 32-year-old grandson.  Labs with PCP 10/13/19 via [facility] PN:  Total cholesterol 216, triglycerides 136, HDL 35, LDL 156  Creatinine 1.57, GFR 49, K 5.1, AST 17, ALT 20  TSH 2.93  Hb 14.8   EKGs/Labs/Other Studies Reviewed:   The following studies were reviewed today:  Echo 11/28/19 1. Left ventricular ejection fraction, by estimation, is 55 to 60%. The  left ventricle has normal function. The left ventricle has no regional  wall motion abnormalities. There  is mild left ventricular hypertrophy.  Left ventricular diastolic parameters  are consistent with Grade II diastolic dysfunction (pseudonormalization).   2. Right ventricular systolic function is normal. The right ventricular  size is normal. There is normal pulmonary artery systolic pressure.   3. Left atrial size was mildly dilated.   4. The mitral valve is grossly normal. Mild to moderate mitral valve  regurgitation. No evidence of mitral stenosis.   5. The aortic valve was not well visualized. Aortic valve regurgitation  is not visualized. No aortic stenosis is present.   6.  There is mild dilatation of the ascending aorta measuring 36 mm.   7. The inferior vena cava is normal in size with greater than 50%  respiratory variability, suggesting right atrial pressure of 3 mmHg.   Lexiscan Myoview 11/2019 Pharmacological myocardial perfusion imaging study with no significant  ischemia Normal wall motion, EF estimated at 40% (EF possibly depressed secondary to GI uptake artifact) No EKG changes concerning for ischemia at peak stress or in recovery. CT attenuation correction images with mild aortic atherosclerosis in the arch, no significant coronary calcification Low risk scan    Echo 09/2015 Left ventricle: The cavity size was normal. Systolic function was    normal. The estimated ejection fraction was in the range of 60%    to 65%. Wall motion was normal; there were no regional wall    motion abnormalities. Doppler parameters are consistent with    abnormal left ventricular relaxation (grade 1 diastolic    dysfunction).  - Mitral valve: There was mild regurgitation.  - Left atrium: The atrium was normal in size.  - Right ventricle: Systolic function was normal.  - Pulmonary arteries: Systolic pressure was within the normal    range.   Cardiac cath 10/2013 Normal coronaries. No coronary artery disease.   EKG:  EKG is ordered today.  The ekg ordered today demonstrates SR 85 bpm with no acute ST/T wave changes.   Recent Labs: No results found for requested labs within last 8760 hours.  Recent Lipid Panel No results found for: CHOL, TRIG, HDL, CHOLHDL, VLDL, LDLCALC, LDLDIRECT  Home Medications   Current Meds  Medication Sig  . allopurinol (ZYLOPRIM) 100 MG tablet Take 100 mg by mouth daily.   Marland Kitchen allopurinol (ZYLOPRIM) 300 MG tablet Take 300 mg by mouth daily.   Marland Kitchen aspirin EC 81 MG tablet Take 81 mg by mouth daily.  . colchicine 0.6 MG tablet TAKE 1 TABLET (0.6 MG TOTAL) BY MOUTH 2 (TWO) TIMES DAILY AS NEEDED.  . flecainide (TAMBOCOR) 50 MG tablet TAKE 1  TABLET (50 MG TOTAL) BY MOUTH 2 (TWO) TIMES DAILY AS NEEDED.  Marland Kitchen FLUZONE QUADRIVALENT 0.5 ML injection Inject 0.5 mLs into the muscle once.  . furosemide (LASIX) 20 MG tablet TAKE 1 TABLET (20 MG TOTAL) BY MOUTH AS NEEDED (FOR FULLNESS AND/OR LEG SWELLING).  Marland Kitchen lisinopril-hydrochlorothiazide (ZESTORETIC) 10-12.5 MG tablet Take 1 tablet by mouth daily.  . metoprolol succinate (TOPROL XL) 25 MG 24 hr tablet Take 1 tablet (25 mg total) by mouth daily.  . NON FORMULARY CPAP DAILY  . NON FORMULARY Oxygen 3 Liters    Review of Systems      Review of Systems  Constitution: Negative for chills, fever and malaise/fatigue.  Cardiovascular: Positive for palpitations. Negative for chest pain, dyspnea on exertion, leg swelling, near-syncope, orthopnea and syncope.  Respiratory: Positive for shortness of breath. Negative for cough and wheezing.   Gastrointestinal: Negative for nausea and vomiting.  Neurological: Positive for light-headedness. Negative for dizziness and weakness.   All other systems reviewed and are otherwise negative except as noted above.  Physical Exam    VS:  BP 126/86 (BP Location: Left Arm, Patient Position: Sitting, Cuff Size: Normal)   Pulse 74   Ht 5\' 9"  (1.753 m)   Wt 237 lb (107.5 kg)   SpO2 96%   BMI 35.00 kg/m  , BMI Body mass index is 35 kg/m. GEN: Well nourished, overweight, well developed, in no acute distress. HEENT: normal. Neck: Supple, no JVD, carotid bruits, or masses. Cardiac: RRR, no murmurs, rubs, or gallops. No clubbing, cyanosis, edema.  Radials/DP/PT 2+ and equal bilaterally.  Respiratory:  Respirations regular and unlabored, clear to auscultation bilaterally. GI: Soft, nontender, nondistended, BS + x 4. MS: No deformity or atrophy. Skin: Warm and dry, no rash. Neuro:  Strength and sensation are intact. Psych: Normal affect. Anxious.    Assessment & Plan    1. Chest pain - No recurrent chest pain. Normal coronary arteries by cath 2015. Lexiscan  Myoview 11/2019 low risk study. No indication for further ischemic evaluation.  Heart healthy, low-sodium diet recommended.  Regular cardiovascular exercise encouraged.    2. Lightheadedness - No recurrent episodes of near syncope.  Symptoms are primarily orthostatic occurring with position changes such as bending over.  Recommend staying well-hydrated, careful position changes.    3. Palpitations/PSVT/PAF - Remote episode PAF after cardiac catheterization 2015. Has since been managed with Flecainide as needed. Not on anticoagulation due to CHADS2VASc of 1 (HTN). Recent labs by PCP with normal CBC, TSH, electrolytes.  CIWA 11/27/2019 with primarily sinus rhythm.  One episode of SVT of 8 beats at 190 bpm which was asymptomatic.  Patient triggered events were associated with sinus tachycardia.  He was subsequently started on Toprol 25 mg daily.  Continue Toprol 25mg  daily and PRN Flecainide.  Discussed that if he has recurrent palpitations or requires as needed flecainide more frequently could consider up titration of metoprolol to 50 mg. Anticipate anxiety is contributory to his palpitations as they were associated with ST on monitor.   4. High risk medication use - Flecainide secondary to remote PAF. Recent ZIO with no evidence of PAF. On appropriate AV nodal blocking agent Toprol XL 25mg  daily.  5. HTN - Well controlled. Continue Lisinopril-HCTZ 10-12.5mg  daily. Encouraged to check at home and keep a log.  6. CKDIII - Follows with nephrology. Careful titration of antihypertensives and diuretics. BMP 10/13/19 creatinine 1.57, BUN 17, GFR 49.  7. LE edema - Very intermittent. Likely venous insufficiency and dependent edema as he stands all day at work. Well controlled on Lasix 20mg  PRN.  8. Gr2DD - Noted on echo 11/2018. Continue PRN Lasix. Euvolemic and well compensated on exam.   9. HLD - Tells me he was on cholesterol medication in the past and had muscle aches, uncertain which medication he was on.  Lipid panel with PCP 10/13/19 total cholesterol 216, HDL 35, LDL 156, triglycerides 136. Likely requires cholesterol medication to get LDL to goal of <100. Prefers dietary changes at this time. Lipid lowering diet recommended.   10. OSA - Reports compliance with CPAP. Does note some orthopnea. Echo 11/2018 with normal LVEF and gr2DD - euvolemic on exam today. His previous pulmonologist is no longer at that location, new referral sent at last visit.   11. Mild dilation ascending aorta - Noted on echo 11/28/19 with measurement 54mm. Continue optimal BP control. May consider repeat study in  1 year for monitoring.   Disposition: Follow up in 4 months with Dr. Mariah Milling or APP.    Alver Sorrow, NP 12/01/2019, 3:54 PM

## 2019-12-01 NOTE — Patient Instructions (Addendum)
Medication Instructions:  Continue current medications.  *If you need a refill on your cardiac medications before your next appointment, please call your pharmacy*  Lab Work: No lab work today.  If you have labs (blood work) drawn today and your tests are completely normal, you will receive your results only by: Marland Kitchen MyChart Message (if you have MyChart) OR . A paper copy in the mail If you have any lab test that is abnormal or we need to change your treatment, we will call you to review the results.  Testing/Procedures: Your EKG today shows normal sinus rhythm which is a great result!  Stress test was low risk - showed no blockages.  Echocardiogram showed heart has a strong pumping function and normal heart valves. Mild thickening of left ventricle muscle tells [place] to monitor blood pressure closely, it was great today. Your heart is a little stiff which is very common, but is likely why you have a tendency to swell. Continue to use Lasix as needed.  Your monitor you wore showed primarily sinus rhythm with occasional fast, but regular heart beats. The Metoprolol will help control this. There was no atrial fibrillation.   Follow-Up: At [facility], you and your health needs are our priority.  As part of our continuing mission to provide you with exceptional heart care, we have created designated Provider Care Teams.  These Care Teams include your primary Cardiologist (physician) and Advanced Practice Providers (APPs -  Physician Assistants and Nurse Practitioners) who all work together to provide you with the care you need, when you need it.  We recommend signing up for the patient portal called "MyChart".  Sign up information is provided on this After Visit Summary.  MyChart is used to connect with patients for Virtual Visits (Telemedicine).  Patients are able to view lab/test results, encounter notes, upcoming appointments, etc.  Non-urgent messages can be sent to your provider as well.    To learn more about what you can do with MyChart, go to [email or web address].    Your next appointment:   4 month(s)  The format for your next appointment:   In Person  Provider:   You may see Julien Nordmann, MD or one of the following Advanced Practice Providers on your designated Care Team:    Nicolasa Ducking, NP  Eula Listen, PA-C  Marisue Ivan, PA-C  Other Instructions   May take extra half tablet of Metoprolol as needed for palpitations.  If you find elevated heart rates are occurring more often, please let us know. We can talk about increasing your dose of Metoprolol.  Follow up with pulmonology as scheduled. You're seeing their NP who is FANTASTIC!

## 2019-12-26 ENCOUNTER — Ambulatory Visit: Payer: BC Managed Care – PPO | Admitting: Adult Health

## 2019-12-26 ENCOUNTER — Other Ambulatory Visit: Payer: Self-pay

## 2019-12-26 ENCOUNTER — Encounter: Payer: Self-pay | Admitting: Adult Health

## 2019-12-26 ENCOUNTER — Ambulatory Visit
Admission: RE | Admit: 2019-12-26 | Discharge: 2019-12-26 | Disposition: A | Discharge status: Home / Self Care | Payer: BC Managed Care – PPO | Source: Ambulatory Visit | Attending: Adult Health | Admitting: Adult Health

## 2019-12-26 VITALS — BP 130/80 | HR 93 | Temp 98.6°F | Ht 69.0 in | Wt 239.2 lb

## 2019-12-26 DIAGNOSIS — R0602 Shortness of breath: Secondary | ICD-10-CM

## 2019-12-26 DIAGNOSIS — G4733 Obstructive sleep apnea (adult) (pediatric): Secondary | ICD-10-CM

## 2019-12-26 DIAGNOSIS — Z9989 Dependence on other enabling machines and devices: Secondary | ICD-10-CM

## 2019-12-26 NOTE — Assessment & Plan Note (Signed)
Excellent control on  CPAP. No changes today.  Plan  Patient Instructions  Continue on CPAP .At bedtime   Keep up good work.  Work on healthy weight loss.  Do not drive if sleepy   Chest xray today .  Follow up with Dr. Belia Heman or APP in 4- weeks with PFT and As needed   Please contact office for sooner follow up if symptoms do not improve or worsen or seek emergency care

## 2019-12-26 NOTE — Patient Instructions (Addendum)
Continue on CPAP .At bedtime   Keep up good work.  Work on healthy weight loss.  Do not drive if sleepy   Chest xray today .  Follow up with Dr. Belia Heman or APP in 4- weeks with PFT and As needed   Please contact office for sooner follow up if symptoms do not improve or worsen or seek emergency care

## 2019-12-26 NOTE — Assessment & Plan Note (Signed)
Shortness of breath questionable etiology. Cardiology work-up is ongoing. We will check chest x-ray today. Set up for pulmonary function testing.  Plan  Patient Instructions  Continue on CPAP .At bedtime   Keep up good work.  Work on healthy weight loss.  Do not drive if sleepy   Chest xray today .  Follow up with Dr. Belia Heman or APP in 4- weeks with PFT and As needed   Please contact office for sooner follow up if symptoms do not improve or worsen or seek emergency care

## 2019-12-26 NOTE — Progress Notes (Signed)
@Patient  ID: Randy Schneider, male    DOB: Oct 27, 1965, 54 y.o.   MRN: [id number]  Chief Complaint  Patient presents with  . Follow-up    OSA     Referring provider: Arlyss QueenConroy, Nathan, PA-C  HPI: 54 year old male followed for obstructive sleep apnea  TEST/EVENTS :    12/26/2019 Follow up : Obstructive sleep apnea he returns for a follow-up visit. Patient was last seen in 2019. He has underlying sleep apnea. Is on nocturnal CPAP. Patient says he wears his CPAP every night. He does feel rested on CPAP however has extreme fatigue throughout the day. With low energy level. Patient's CPAP download shows excellent compliance with daily average usage at 7 hours. AHI 1.6. Patient is on auto CPAP 10 to 16 cm H2O.  Patient complains over the last couple months he has had ongoing shortness of breath low energy level decreased activity intolerance. Has been following with cardiology for cardiac work-up for etiology of his weakness and shortness of breath. Patient complains he does have some minimal cough with clear mucus. Intermittent wheezing. Feels short of breath with activities.  Is a never smoker. Has had heavy marijuana use in his 5120s. Patient works full-time. Does activities at home. Does not formally exercise. Does have a history of A. fib. Has never been on amiodarone. Is not on anticoagulation. Has not had Covid 19 infection as far as he knows. No previous Covid vaccine.  Allergies  Allergen Reactions  . Codeine Other (See Comments)    Nervous   . Shrimp [Shellfish Allergy]   . Sulfa Antibiotics Rash    Immunization History  Administered Date(s) Administered  . Influenza-Unspecified 05/22/2019    Past Medical History:  Diagnosis Date  . A-fib (HCC)   . Acute gastritis with hemorrhage   . Cardiomegaly   . Cervical spondylosis without myelopathy   . CHF (congestive heart failure) (HCC)   . Depression   . Hiatal hernia   . Hypertension   . Impotence of organic origin   .  Leaky heart valve   . Lumbago   . Migraine without aura, without mention of intractable migraine without mention of status migrainosus   . Pure hyperglyceridemia   . Sleep apnea     Tobacco History: Social History   Tobacco Use  Smoking Status Never Smoker  Smokeless Tobacco Never Used   Counseling given: Not Answered   Outpatient Medications Prior to Visit  Medication Sig Dispense Refill  . allopurinol (ZYLOPRIM) 100 MG tablet Take 100 mg by mouth daily.     Marland Kitchen. allopurinol (ZYLOPRIM) 300 MG tablet Take 300 mg by mouth daily.     Marland Kitchen. aspirin EC 81 MG tablet Take 81 mg by mouth daily.    . colchicine 0.6 MG tablet TAKE 1 TABLET (0.6 MG TOTAL) BY MOUTH 2 (TWO) TIMES DAILY AS NEEDED. 60 tablet 1  . flecainide (TAMBOCOR) 50 MG tablet TAKE 1 TABLET (50 MG TOTAL) BY MOUTH 2 (TWO) TIMES DAILY AS NEEDED. 180 tablet 1  . FLUZONE QUADRIVALENT 0.5 ML injection Inject 0.5 mLs into the muscle once.  0  . furosemide (LASIX) 20 MG tablet TAKE 1 TABLET (20 MG TOTAL) BY MOUTH AS NEEDED (FOR FULLNESS AND/OR LEG SWELLING). 90 tablet 3  . lisinopril-hydrochlorothiazide (ZESTORETIC) 10-12.5 MG tablet Take 1 tablet by mouth daily. 90 tablet 0  . metoprolol succinate (TOPROL XL) 25 MG 24 hr tablet Take 1 tablet (25 mg total) by mouth daily. 90 tablet 3  . NON FORMULARY  CPAP DAILY    . NON FORMULARY Oxygen 3 Liters     No facility-administered medications prior to visit.     Review of Systems:   Constitutional:   No  weight loss, night sweats,  Fevers, chills, + fatigue, or  lassitude.  HEENT:   No headaches,  Difficulty swallowing,  Tooth/dental problems, or  Sore throat,                No sneezing, itching, ear ache, nasal congestion, post nasal drip,   CV:  No chest pain,  Orthopnea, PND, swelling in lower extremities, anasarca, dizziness, palpitations, syncope.   GI  No heartburn, indigestion, abdominal pain, nausea, vomiting, diarrhea, change in bowel habits, loss of appetite, bloody stools.     Resp: .  No chest wall deformity  Skin: no rash or lesions.  GU: no dysuria, change in color of urine, no urgency or frequency.  No flank pain, no hematuria   MS:  No joint pain or swelling.  No decreased range of motion.  No back pain.    Physical Exam  BP 130/80 (BP Location: Left Arm, Cuff Size: Normal)   Pulse 93   Temp 98.6 F (37 C) (Oral)   Ht 5\' 9"  (1.753 m)   Wt 239 lb 3.2 oz (108.5 kg)   SpO2 99%   BMI 35.32 kg/m   GEN: A/Ox3; pleasant , NAD, well nourished    HEENT:  [place]/AT,  NOSE-clear, THROAT-clear, no lesions, no postnasal drip or exudate noted.   NECK:  Supple w/ fair ROM; no JVD; normal carotid impulses w/o bruits; no thyromegaly or nodules palpated; no lymphadenopathy.    RESP  Clear  P & A; w/o, wheezes/ rales/ or rhonchi. no accessory muscle use, no dullness to percussion  CARD:  RRR, no m/r/g, no peripheral edema, pulses intact, no cyanosis or clubbing.  GI:   Soft & nt; nml bowel sounds; no organomegaly or masses detected.   Musco: Warm bil, no deformities or joint swelling noted.   Neuro: alert, no focal deficits noted.    Skin: Warm, no lesions or rashes    Lab Results:  CBC    Component Value Date/Time   WBC 7.5 01/03/2014 1254   RBC 4.69 01/03/2014 1254   HGB 14.0 01/03/2014 1254   HCT 40.8 01/03/2014 1254   PLT 176 01/03/2014 1254   MCV 87 01/03/2014 1254   MCH 29.8 01/03/2014 1254   MCHC 34.3 01/03/2014 1254   RDW 13.3 01/03/2014 1254   LYMPHSABS 1.9 09/29/2013 1444   EOSABS 0.1 09/29/2013 1444   BASOSABS 0.0 09/29/2013 1444    BMET    Component Value Date/Time   NA 137 01/03/2014 1254   K 3.9 01/03/2014 1254   CL 103 01/03/2014 1254   CO2 30 01/03/2014 1254   GLUCOSE 97 01/03/2014 1254   BUN 11 01/03/2014 1254   CREATININE 1.40 (H) 02/11/2018 0806   CREATININE 1.41 (H) 01/03/2014 1254   CALCIUM 8.8 01/03/2014 1254   GFRNONAA 58 (L) 01/03/2014 1254   GFRAA >60 01/03/2014 1254    BNP No results found for:  BNP  ProBNP    Component Value Date/Time   PROBNP 6.8 09/25/2010 1414    Imaging: ECHOCARDIOGRAM COMPLETE  Result Date: 11/28/2019    ECHOCARDIOGRAM REPORT   Patient Name:   Randy Schneider Date of Exam: 11/28/2019 Medical Rec #:  11/30/2019      Height:       69.0 in Accession #:  [id number]     Weight:       237.0 lb Date of Birth:  Apr 02, 1966      BSA:          2.221 m Patient Age:    53 years       BP:           130/84 mmHg Patient Gender: M              HR:           68 bpm. Exam Location:  [facility] Procedure: 2D Echo, Cardiac Doppler and Color Doppler Indications:    Atrial fibrillation and Flutter; R06.02 SOB  History:        Patient has prior history of Echocardiogram examinations, most                 recent 09/06/2015. Arrythmias:Atrial Fibrillation and Tachycardia,                 Signs/Symptoms:Shortness of Breath; Risk Factors:Hypertension,                 Dyslipidemia, Sleep Apnea and Non-Smoker.  Sonographer:    Quentin Ore RDMS, RVT, RDCS Referring Phys: [id number] CAITLIN S WALKER IMPRESSIONS  1. Left ventricular ejection fraction, by estimation, is 55 to 60%. The left ventricle has normal function. The left ventricle has no regional wall motion abnormalities. There is mild left ventricular hypertrophy. Left ventricular diastolic parameters are consistent with Grade II diastolic dysfunction (pseudonormalization).  2. Right ventricular systolic function is normal. The right ventricular size is normal. There is normal pulmonary artery systolic pressure.  3. Left atrial size was mildly dilated.  4. The mitral valve is grossly normal. Mild to moderate mitral valve regurgitation. No evidence of mitral stenosis.  5. The aortic valve was not well visualized. Aortic valve regurgitation is not visualized. No aortic stenosis is present.  6. There is mild dilatation of the ascending aorta measuring 36 mm.  7. The inferior vena cava is normal in size with greater than 50% respiratory variability,  suggesting right atrial pressure of 3 mmHg. FINDINGS  Left Ventricle: Left ventricular ejection fraction, by estimation, is 55 to 60%. The left ventricle has normal function. The left ventricle has no regional wall motion abnormalities. The left ventricular internal cavity size was normal in size. There is  mild left ventricular hypertrophy. Left ventricular diastolic parameters are consistent with Grade II diastolic dysfunction (pseudonormalization). Right Ventricle: The right ventricular size is normal. No increase in right ventricular wall thickness. Right ventricular systolic function is normal. There is normal pulmonary artery systolic pressure. The tricuspid regurgitant velocity is 2.71 m/s, and  with an assumed right atrial pressure of 3 mmHg, the estimated right ventricular systolic pressure is 32.4 mmHg. Left Atrium: Left atrial size was mildly dilated. Right Atrium: Right atrial size was normal in size. Pericardium: There is no evidence of pericardial effusion. Mitral Valve: The mitral valve is grossly normal. Mild to moderate mitral valve regurgitation. No evidence of mitral valve stenosis. Tricuspid Valve: The tricuspid valve is normal in structure. Tricuspid valve regurgitation is trivial. Aortic Valve: The aortic valve was not well visualized. Aortic valve regurgitation is not visualized. No aortic stenosis is present. Aortic valve mean gradient measures 5.0 mmHg. Aortic valve peak gradient measures 10.0 mmHg. Aortic valve area, by VTI measures 2.56 cm. Pulmonic Valve: The pulmonic valve was not well visualized. Pulmonic valve regurgitation is mild. No evidence of pulmonic stenosis. Aorta: The aortic root is normal  in size and structure. There is mild dilatation of the ascending aorta measuring 36 mm. Pulmonary Artery: The pulmonary artery is of normal size. Venous: The inferior vena cava is normal in size with greater than 50% respiratory variability, suggesting right atrial pressure of 3 mmHg.  IAS/Shunts: No atrial level shunt detected by color flow Doppler.  LEFT VENTRICLE PLAX 2D LVIDd:         5.00 cm      Diastology LVIDs:         3.40 cm      LV e' lateral:   11.90 cm/s LV PW:         1.20 cm      LV E/e' lateral: 8.7 LV IVS:        1.20 cm      LV e' medial:    7.51 cm/s LVOT diam:     2.10 cm      LV E/e' medial:  13.7 LV SV:         80 LV SV Index:   36           2D Longitudinal Strain LVOT Area:     3.46 cm     2D Strain GLS Avg:     -14.8 %  LV Volumes (MOD) LV vol d, MOD A2C: 89.1 ml  3D Volume EF: LV vol d, MOD A4C: 113.0 ml 3D EF:        50 % LV vol s, MOD A2C: 38.7 ml  LV EDV:       168 ml LV vol s, MOD A4C: 53.1 ml  LV ESV:       83 ml LV SV MOD A2C:     50.4 ml  LV SV:        85 ml LV SV MOD A4C:     113.0 ml LV SV MOD BP:      56.5 ml RIGHT VENTRICLE             IVC RV Basal diam:  4.10 cm     IVC diam: 1.80 cm RV Mid diam:    4.00 cm RV S prime:     14.00 cm/s TAPSE (M-mode): 2.5 cm LEFT ATRIUM             Index       RIGHT ATRIUM           Index LA diam:        3.90 cm 1.76 cm/m  RA Area:     14.00 cm LA Vol (A2C):   42.5 ml 19.14 ml/m RA Volume:   32.30 ml  14.55 ml/m LA Vol (A4C):   60.2 ml 27.11 ml/m LA Biplane Vol: 50.7 ml 22.83 ml/m  AORTIC VALVE                    PULMONIC VALVE AV Area (Vmax):    2.65 cm     PV Vmax:       0.77 m/s AV Area (Vmean):   2.50 cm     PV Peak grad:  2.4 mmHg AV Area (VTI):     2.56 cm AV Vmax:           158.00 cm/s AV Vmean:          109.000 cm/s AV VTI:            0.314 m AV Peak Grad:      10.0 mmHg AV Mean Grad:  5.0 mmHg LVOT Vmax:         121.00 cm/s LVOT Vmean:        78.700 cm/s LVOT VTI:          0.232 m LVOT/AV VTI ratio: 0.74  AORTA Ao Root diam: 3.20 cm Ao Asc diam:  3.55 cm Ao Arch diam: 3.0 cm MITRAL VALVE                TRICUSPID VALVE MV Area (PHT): 4.44 cm     TR Peak grad:   29.4 mmHg MV Decel Time: 171 msec     TR Vmax:        271.00 cm/s MV E velocity: 103.00 cm/s MV A velocity: 84.00 cm/s   SHUNTS MV E/A ratio:  1.23          Systemic VTI:  0.23 m                             Systemic Diam: 2.10 cm Yvonne Kendall MD Electronically signed by Yvonne Kendall MD Signature Date/Time: 11/28/2019/6:36:17 PM    Final    LONG TERM MONITOR-LIVE TELEMETRY (3-14 DAYS)  Result Date: 12/17/2019 Event Monitor Normal sinus rhythm avg HR of 84 bpm. 1 run of Supraventricular Tachycardia occurred lasting 8 beats with a max rate of 190 bpm (avg 180 bpm). Isolated SVEs were rare (<1.0%), SVE Couplets were rare (<1.0%), and no SVE Triplets were present. Isolated VEs were rare (<1.0%), and no VE Couplets or VE Triplets were present. Ventricular Trigeminy was present. Patient triggered events were not associated with significant arrhythmia Signed, Dossie Arbour, MD, Ph.D [facility]      PFT Results Latest Ref Rng & Units 09/06/2015  FVC-Pre L 3.95  FVC-Predicted Pre % 81  FVC-Post L 4.24  FVC-Predicted Post % 87  Pre FEV1/FVC % % 84  Post FEV1/FCV % % 87  FEV1-Pre L 3.34  FEV1-Predicted Pre % 88  FEV1-Post L 3.67  DLCO UNC% % 92  DLCO COR %Predicted % 112    No results found for: NITRICOXIDE      Assessment & Plan:   OSA on CPAP Excellent control on  CPAP. No changes today.  Plan  Patient Instructions  Continue on CPAP .At bedtime   Keep up good work.  Work on healthy weight loss.  Do not drive if sleepy   Chest xray today .  Follow up with Dr. Belia Heman or APP in 4- weeks with PFT and As needed   Please contact office for sooner follow up if symptoms do not improve or worsen or seek emergency care         SHORTNESS OF BREATH Shortness of breath questionable etiology. Cardiology work-up is ongoing. We will check chest x-ray today. Set up for pulmonary function testing.  Plan  Patient Instructions  Continue on CPAP .At bedtime   Keep up good work.  Work on healthy weight loss.  Do not drive if sleepy   Chest xray today .  Follow up with Dr. Belia Heman or APP in 4- weeks with PFT and As needed   Please  contact office for sooner follow up if symptoms do not improve or worsen or seek emergency care           Rubye Oaks, NP 12/26/2019

## 2019-12-27 ENCOUNTER — Encounter: Payer: Self-pay | Admitting: Gastroenterology

## 2020-01-04 ENCOUNTER — Telehealth: Payer: Self-pay | Admitting: Adult Health

## 2020-01-04 NOTE — Telephone Encounter (Signed)
Called and spoke with patient's wife Harriett Sine. Let her know that per TP CXR was clear and to keep follow up appt that is already scheduled. She expressed understanding. Nothing further needed at this time

## 2020-01-17 ENCOUNTER — Other Ambulatory Visit: Payer: Self-pay

## 2020-01-17 ENCOUNTER — Other Ambulatory Visit
Admission: RE | Admit: 2020-01-17 | Discharge: 2020-01-17 | Disposition: A | Discharge status: Home / Self Care | Payer: BC Managed Care – PPO | Source: Ambulatory Visit | Attending: Adult Health | Admitting: Adult Health

## 2020-01-17 DIAGNOSIS — Z01812 Encounter for preprocedural laboratory examination: Secondary | ICD-10-CM | POA: Diagnosis not present

## 2020-01-17 DIAGNOSIS — Z20822 Contact with and (suspected) exposure to covid-19: Secondary | ICD-10-CM | POA: Diagnosis not present

## 2020-01-18 ENCOUNTER — Ambulatory Visit: Payer: BC Managed Care – PPO | Attending: Adult Health

## 2020-01-18 DIAGNOSIS — R0602 Shortness of breath: Secondary | ICD-10-CM | POA: Insufficient documentation

## 2020-01-18 LAB — SARS CORONAVIRUS 2 (TAT 6-24 HRS): SARS Coronavirus 2: NEGATIVE

## 2020-01-18 MED ORDER — ALBUTEROL SULFATE (2.5 MG/3ML) 0.083% IN NEBU
2.5000 mg | INHALATION_SOLUTION | Freq: Once | RESPIRATORY_TRACT | Status: AC
  Administered 2020-01-18: 2.5 mg via RESPIRATORY_TRACT

## 2020-01-26 ENCOUNTER — Ambulatory Visit (AMBULATORY_SURGERY_CENTER): Payer: Self-pay | Admitting: *Deleted

## 2020-01-26 ENCOUNTER — Other Ambulatory Visit: Payer: Self-pay

## 2020-01-26 ENCOUNTER — Telehealth: Payer: Self-pay | Admitting: *Deleted

## 2020-01-26 VITALS — Ht 69.0 in | Wt 241.0 lb

## 2020-01-26 DIAGNOSIS — Z1211 Encounter for screening for malignant neoplasm of colon: Secondary | ICD-10-CM

## 2020-01-26 DIAGNOSIS — Z01818 Encounter for other preprocedural examination: Secondary | ICD-10-CM

## 2020-01-26 NOTE — Telephone Encounter (Signed)
Dr Myrtie Neither and Randy Schneider,  I saw this pt in [facility] today - Pt states he WEARS 3 LITERS 02 WITH HIS CPAP AT NIGHT- IT IS NOT NASAL CANNULA 02- HE CONNECTS IT TO HIS CPAP with a connector -  He has a hx of A Fib but no anticoagulants- uses Flecanide PRN - has a hx of HTN, gout, CKD 3   Is he okay for a colon 02-09-2020 Friday in the [facility]   Thanks, [place] [facility]

## 2020-01-26 NOTE — Progress Notes (Signed)
[facility] covid test  7-7  WED 8 am -1 pm   Pt states he WEARS 3 LITERS 02 WITH HIS CPAP AT NIGHT- IT IS not NASAL CANNULA 02- HE CONNECTS IT TO HIS CPAP   No egg or soy allergy known to patient  No issues with past sedation with any surgeries  or procedures, no intubation problems  No diet pills per patient No home 02 use per patient  No blood thinners per patient  Pt denies issues with constipation   A fib or A flutter - no anticoagulants  EMMI video sent to pt's e mail  COVID 19 guidelines implemented in [facility] today   Due to the COVID-19 pandemic we are asking patients to follow these guidelines. Please only bring one care partner. Please be aware that your care partner may wait in the car in the parking lot or if they feel like they will be too hot to wait in the car, they may wait in the lobby on the 4th floor. All care partners are required to wear a mask the entire time (we do not have any that we can provide them), they need to practice social distancing, and we will do a Covid check for all patient's and care partners when you arrive. Also we will check their temperature and your temperature. If the care partner waits in their car they need to stay in the parking lot the entire time and we will call them on their cell phone when the patient is ready for discharge so they can bring the car to the front of the building. Also all patient's will need to wear a mask into building.

## 2020-01-27 NOTE — Telephone Encounter (Signed)
Marie,  Oxygen is added to CPAP because the pt desat's so severely if they become apneic.  The oxygen helps the recover more quickly.  This pt does not have severe COPD so he is cleared for anesthetic care at [facility].  Thanks,  Cathlyn Parsons

## 2020-01-30 IMAGING — CR DG KNEE COMPLETE 4+V*R*
1 series · 4 of 4 positions shown · non-contrast
Comparison: None.

CLINICAL DATA: Chronic bilateral knee pain, no known injury

EXAM:
RIGHT KNEE - COMPLETE 4+ VIEW

[Series 1: dg knee complete 4 views right · 0.14mm/px · 4 of 4 slices shown]
[im 1/4]
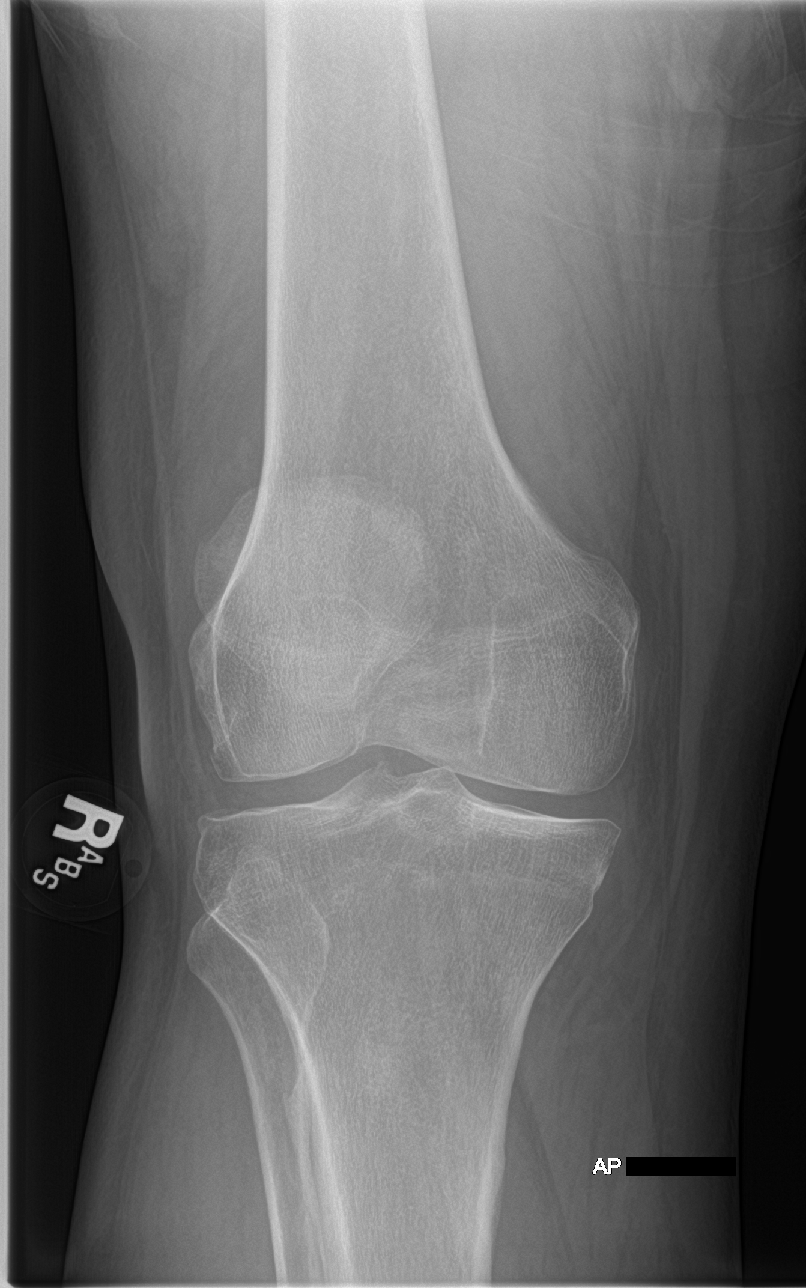
[im 2/4]
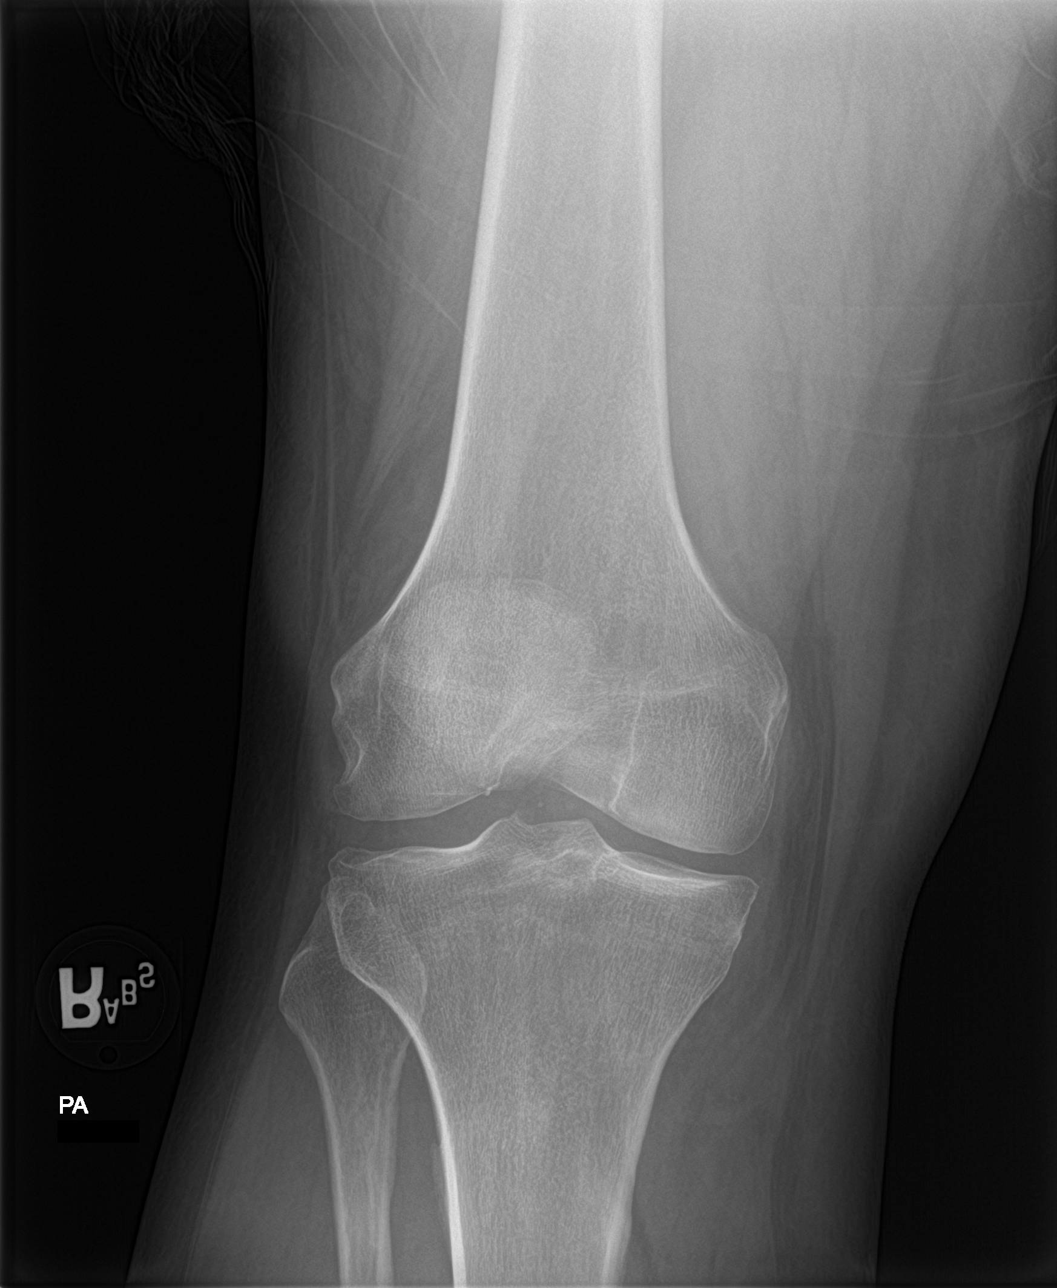
[im 3/4]
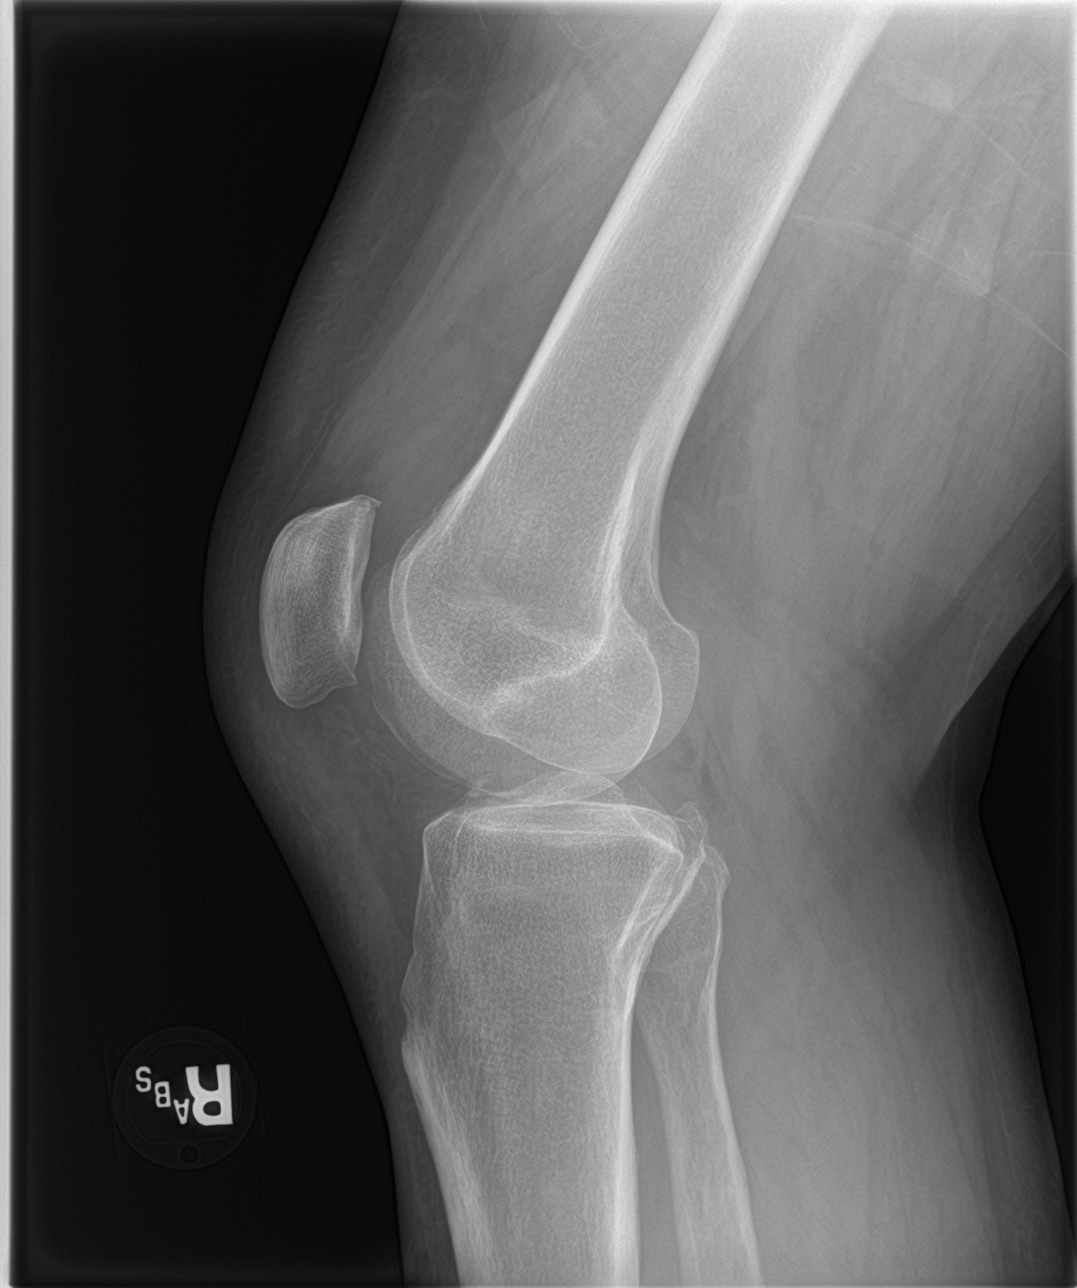
[im 4/4]
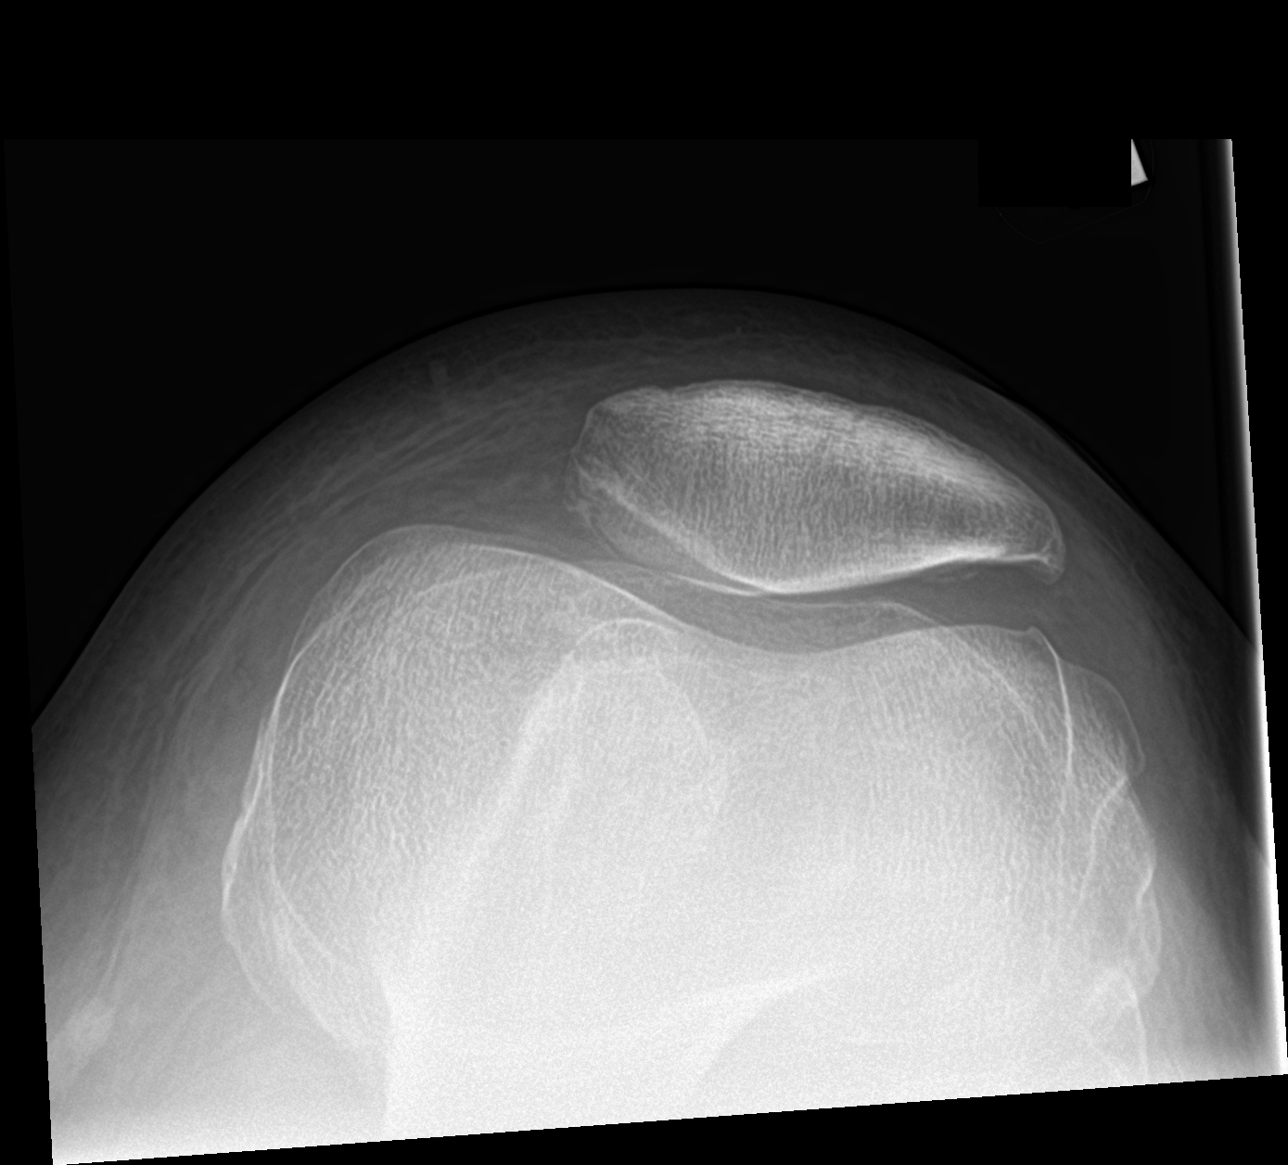

[4 of 4 positions shown; findings below may reference images not displayed]

FINDINGS: No fracture or dislocation is seen.

The joint spaces are preserved. Mild degenerative changes in the
patellofemoral compartment with tiny osteophytes.

Visualized soft tissues are within normal limits.

Small suprapatellar knee joint effusion.
IMPRESSION: Mild degenerative changes with small suprapatellar knee joint
effusion.

## 2020-01-30 IMAGING — MR MR LUMBAR SPINE WO/W CM
6 of 7 series · 34 of 48 positions shown · IV contrast (multihance)
Comparison: CT Abdomen and Pelvis 05/09/2009. Lumbar radiographs
05/07/2009.

CLINICAL DATA: 52-year-old male with lumbar back pain radiating to
the right hip and leg with numbness and tingling for 2 years. Remote
lumbar spine surgery 20 years ago.

EXAM:
MRI LUMBAR SPINE WITHOUT AND WITH CONTRAST
TECHNIQUE: Multiplanar and multiecho pulse sequences of the lumbar spine were
obtained without and with intravenous contrast.
CONTRAST:  20mL MULTIHANCE GADOBENATE DIMEGLUMINE 529 MG/ML IV SOLN

[Series 2: T2 · sagittal · 4.0mm · 0.81mm/px · 5 of 17 slices shown (1 of 2)]
[im 1/17]
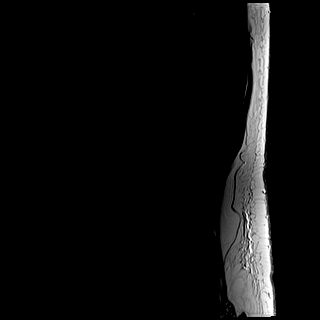
[im 5/17]
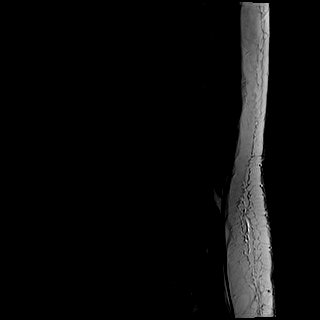
[im 9/17]
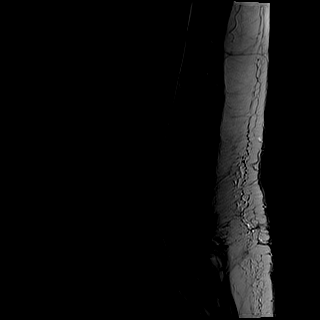
[im 13/17]
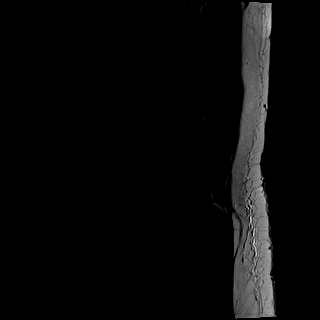
[im 17/17]
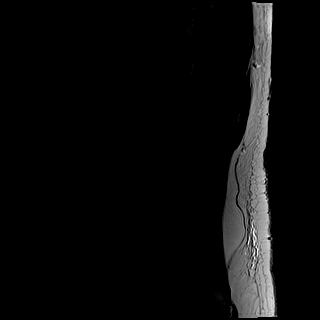

[Series 3: T1 · sagittal · 4.0mm · 0.81mm/px · 5 of 17 slices shown (1 of 2)]
[im 1/17]
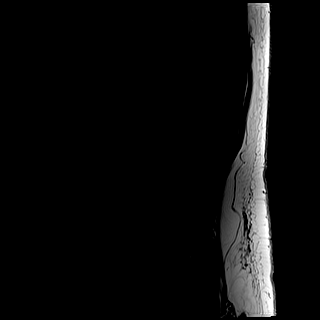
[im 5/17]
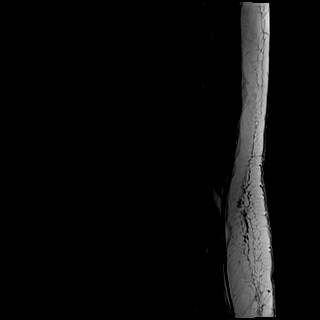
[im 9/17]
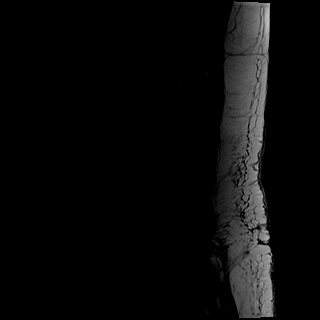
[im 13/17]
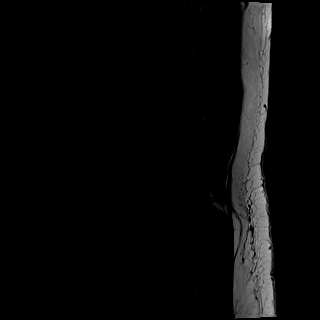
[im 17/17]
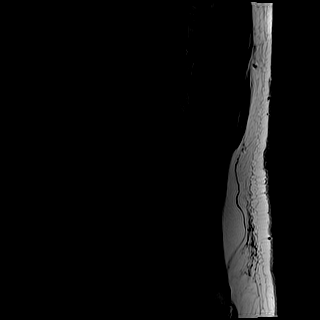

[Series 4: STIR · sagittal · 4.0mm · 0.81mm/px · 4 of 17 slices shown]
[im 1/17]
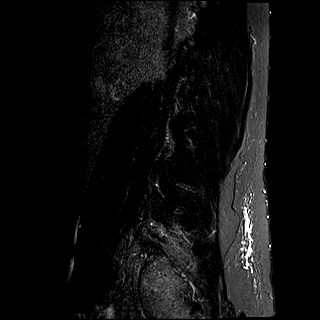
[im 6/17]
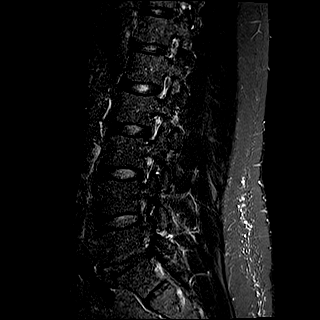
[im 11/17]
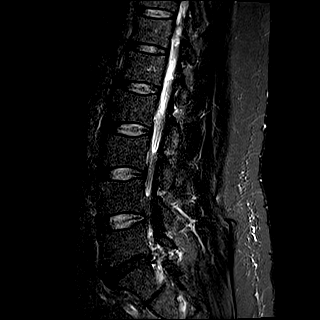
[im 17/17]
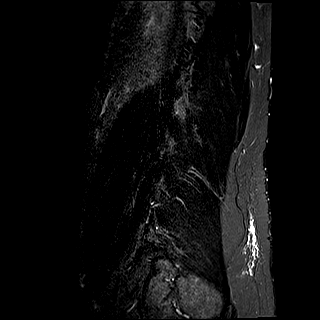

[Series 5: T2 · axial · 4.0mm · 0.78mm/px · z∈[-75,+154]mm · 8 of 42 slices shown (2 of 2)]
[im 1/42]
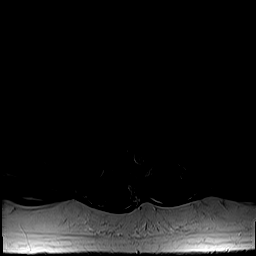
[im 5/42]
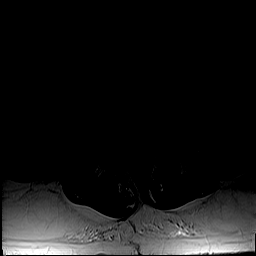
[im 14/42]
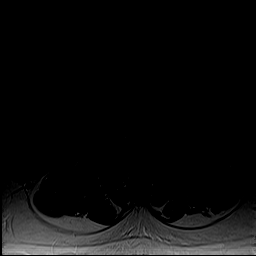
[im 19/42]
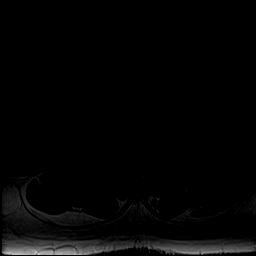
[im 23/42]
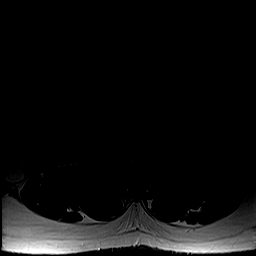
[im 28/42]
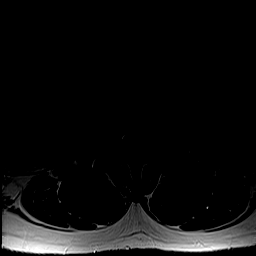
[im 37/42]
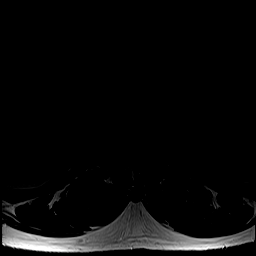
[im 42/42]
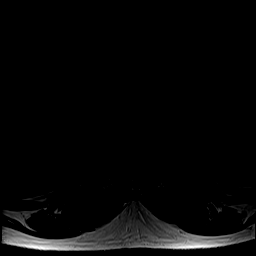

[Series 6: T1 · axial · 4.0mm · 0.39mm/px · z∈[-75,+154]mm · 8 of 42 slices shown (2 of 2)]
[im 1/42]
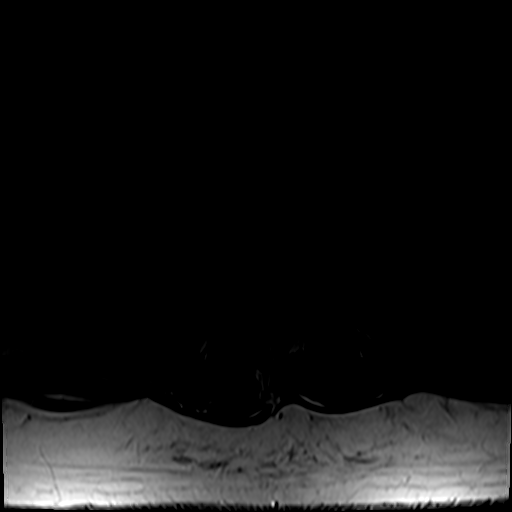
[im 5/42]
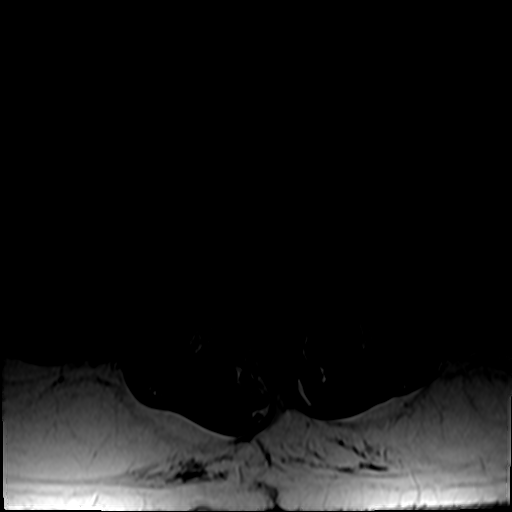
[im 14/42]
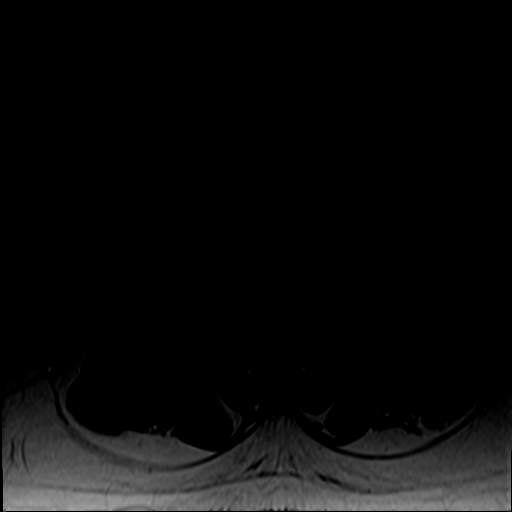
[im 19/42]
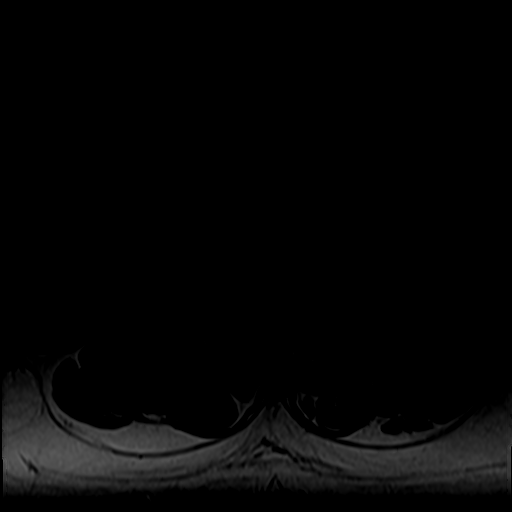
[im 23/42]
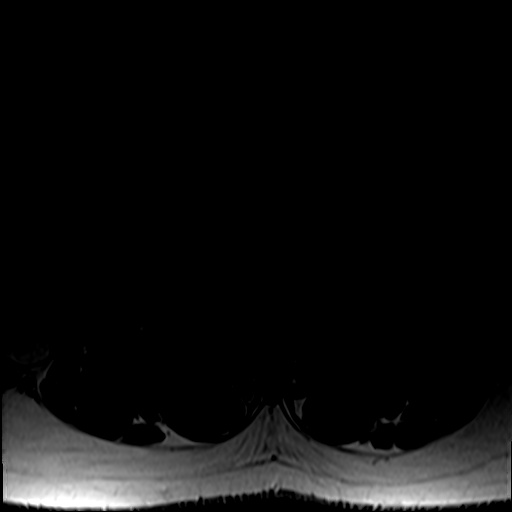
[im 28/42]
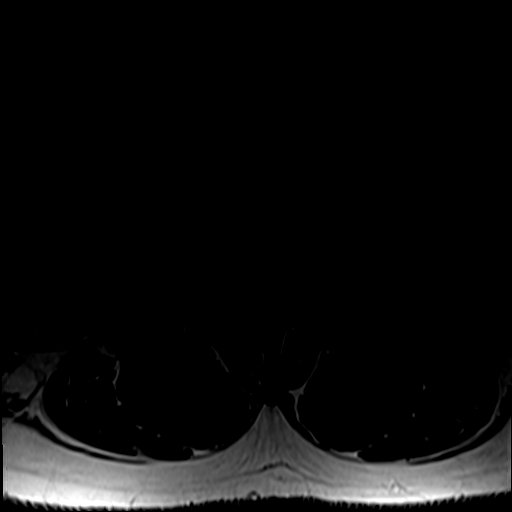
[im 37/42]
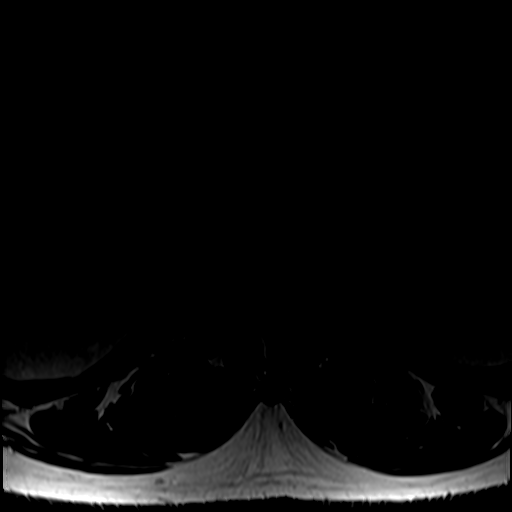
[im 42/42]
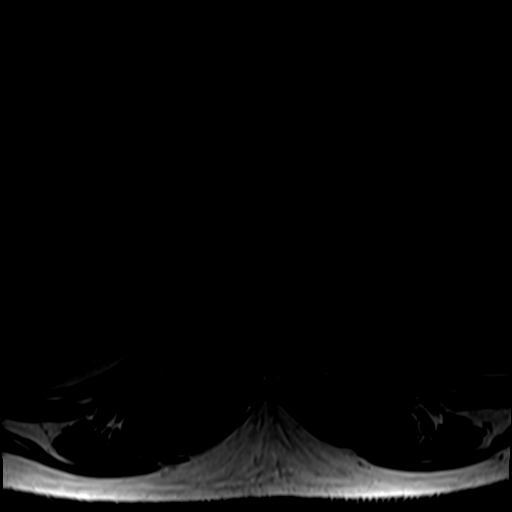

[Series 7: T1 fat-sat post-contrast · sagittal · 4.0mm · 0.81mm/px · 4 of 17 slices shown]
[im 1/17]
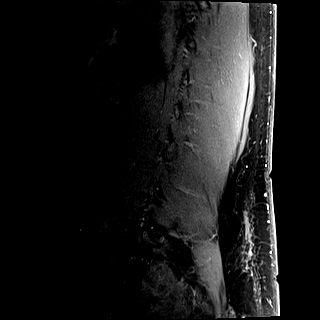
[im 6/17]
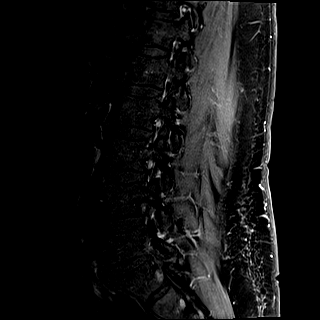
[im 11/17]
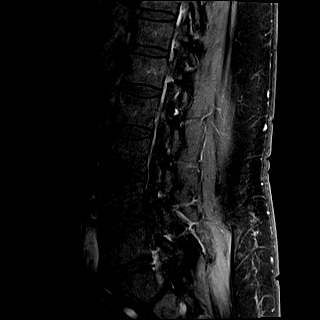
[im 17/17]
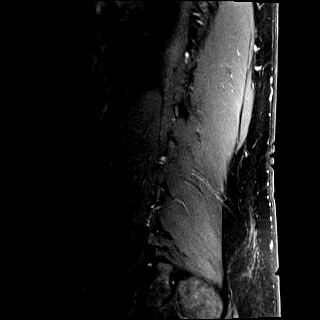

[34 of 48 positions shown; findings below may reference images not displayed]

FINDINGS: Segmentation: Normal lumbar segmentation demonstrated on the
comparisons.

Alignment: Stable, with relatively preserved lumbar lordosis. There
is mild chronic retrolisthesis of L5 on S1.

Vertebrae: Chronic degenerative endplate marrow signal changes at
L5-S1. Background bone marrow signal is normal. No marrow edema or
evidence of acute osseous abnormality. Intact visible sacrum and SI
joints.

Conus medullaris and cauda equina: Conus extends to the L1-L2 level.
No lower spinal cord or conus signal abnormality. No abnormal
intradural enhancement. No dural thickening.

Paraspinal and other soft tissues: Stable since 8747 and negative
visible abdominal viscera. Mild chronic postoperative changes to the
right posterior paraspinal soft tissues at L5-S1.

Disc levels:

T11-T12: Mild facet hypertrophy.

T12-L1:  Negative.

L1-L2:  Negative.

L2-L3:  Negative.

L3-L4:  Negative.

L4-L5: Negative disc. Mild facet hypertrophy and epidural
lipomatosis. No stenosis.

L5-S1: Disc desiccation with some vacuum disc. Disc space loss with
mildly lobulated circumferential disc osteophyte complex.
Broad-based posterior and biforaminal involvement. Chronically right
laminectomy changes with mild to moderate superimposed facet
hypertrophy. Postoperative changes to the right lateral recess with
enhancing granulation tissue and mild architectural distortion.

No spinal or convincing lateral recess stenosis. There is moderate
bilateral L5 neural foraminal stenosis.
IMPRESSION: Normal for age MRI appearance of the lumbar spine aside from chronic
degenerative changes L5-S1 and previous surgery at that level on the
right.
There is right lateral recess granulation tissue, but no spinal or
convincing lateral recess stenosis. There is moderate multifactorial
bilateral L5 neural foraminal stenosis.

## 2020-01-30 NOTE — Telephone Encounter (Signed)
Thank you for the note.  Anesthesia's input reviewed and appreciated.  Colonoscopy in [facility].

## 2020-02-07 ENCOUNTER — Other Ambulatory Visit: Payer: Self-pay

## 2020-02-07 ENCOUNTER — Other Ambulatory Visit
Admission: RE | Admit: 2020-02-07 | Discharge: 2020-02-07 | Disposition: A | Discharge status: Home / Self Care | Payer: BC Managed Care – PPO | Source: Ambulatory Visit | Attending: Gastroenterology | Admitting: Gastroenterology

## 2020-02-07 DIAGNOSIS — Z20822 Contact with and (suspected) exposure to covid-19: Secondary | ICD-10-CM | POA: Insufficient documentation

## 2020-02-07 DIAGNOSIS — Z01812 Encounter for preprocedural laboratory examination: Secondary | ICD-10-CM | POA: Diagnosis not present

## 2020-02-07 LAB — SARS CORONAVIRUS 2 (TAT 6-24 HRS): SARS Coronavirus 2: NEGATIVE

## 2020-02-08 ENCOUNTER — Encounter: Payer: Self-pay | Admitting: Gastroenterology

## 2020-02-09 ENCOUNTER — Ambulatory Visit (AMBULATORY_SURGERY_CENTER): Payer: BC Managed Care – PPO | Admitting: Gastroenterology

## 2020-02-09 ENCOUNTER — Encounter: Payer: Self-pay | Admitting: Gastroenterology

## 2020-02-09 ENCOUNTER — Other Ambulatory Visit: Payer: Self-pay

## 2020-02-09 VITALS — BP 116/82 | HR 70 | Temp 97.1°F | Resp 16 | Ht 69.0 in | Wt 241.0 lb

## 2020-02-09 DIAGNOSIS — Z1211 Encounter for screening for malignant neoplasm of colon: Secondary | ICD-10-CM

## 2020-02-09 MED ORDER — SODIUM CHLORIDE 0.9 % IV SOLN: 500.0000 mL | Freq: Once | INTRAVENOUS | Status: DC

## 2020-02-09 NOTE — Progress Notes (Signed)
Pt's states no medical or surgical changes since previsit or office visit.  Cw vitals and SB IV.

## 2020-02-09 NOTE — Patient Instructions (Signed)
Please read handouts provided. Continue present medications.   YOU HAD AN ENDOSCOPIC PROCEDURE TODAY AT THE [facility]:   Refer to the procedure report that was given to you for any specific questions about what was found during the examination.  If the procedure report does not answer your questions, please call your gastroenterologist to clarify.  If you requested that your care partner not be given the details of your procedure findings, then the procedure report has been included in a sealed envelope for you to review at your convenience later.  YOU SHOULD EXPECT: Some feelings of bloating in the abdomen. Passage of more gas than usual.  Walking can help get rid of the air that was put into your GI tract during the procedure and reduce the bloating. If you had a lower endoscopy (such as a colonoscopy or flexible sigmoidoscopy) you may notice spotting of blood in your stool or on the toilet paper. If you underwent a bowel prep for your procedure, you may not have a normal bowel movement for a few days.  Please Note:  You might notice some irritation and congestion in your nose or some drainage.  This is from the oxygen used during your procedure.  There is no need for concern and it should clear up in a day or so.  SYMPTOMS TO REPORT IMMEDIATELY:  Following lower endoscopy (colonoscopy or flexible sigmoidoscopy):  Excessive amounts of blood in the stool  Significant tenderness or worsening of abdominal pains  Swelling of the abdomen that is new, acute  Fever of 100F or higher   For urgent or emergent issues, a gastroenterologist can be reached at any hour by calling [phone]. Do not use MyChart messaging for urgent concerns.    DIET:  We do recommend a small meal at first, but then you may proceed to your regular diet.  Drink plenty of fluids but you should avoid alcoholic beverages for 24 hours.  ACTIVITY:  You should plan to take it easy for the rest of today and  you should NOT DRIVE or use heavy machinery until tomorrow (because of the sedation medicines used during the test).    FOLLOW UP: Our staff will call the number listed on your records 48-72 hours following your procedure to check on you and address any questions or concerns that you may have regarding the information given to you following your procedure. If we do not reach you, we will leave a message.  We will attempt to reach you two times.  During this call, we will ask if you have developed any symptoms of COVID 19. If you develop any symptoms (ie: fever, flu-like symptoms, shortness of breath, cough etc.) before then, please call [phone].  If you test positive for Covid 19 in the 2 weeks post procedure, please call and report this information to us.    If any biopsies were taken you will be contacted by phone or by letter within the next 1-3 weeks.  Please call us at [phone] if you have not heard about the biopsies in 3 weeks.    SIGNATURES/CONFIDENTIALITY: You and/or your care partner have signed paperwork which will be entered into your electronic medical record.  These signatures attest to the fact that that the information above on your After Visit Summary has been reviewed and is understood.  Full responsibility of the confidentiality of this discharge information lies with you and/or your care-partner.  

## 2020-02-09 NOTE — Op Note (Signed)
[facility] Patient Name: Randy Schneider Procedure Date: 02/09/2020 7:30 AM MRN: [id number] Endoscopist: Sherilyn Cooter L. Myrtie Neither , MD Age: 54 Referring MD:  Date of Birth: [birth year] Gender: Male Account #: [id number] Procedure:                Colonoscopy Indications:              Screening for colorectal malignant neoplasm                            (patient reported a colonoscopy for rectal bleeding                            age 36) Medicines:                Monitored Anesthesia Care Procedure:                Pre-Anesthesia Assessment:                           - Prior to the procedure, a History and Physical                            was performed, and patient medications and                            allergies were reviewed. The patient's tolerance of                            previous anesthesia was also reviewed. The risks                            and benefits of the procedure and the sedation                            options and risks were discussed with the patient.                            All questions were answered, and informed consent                            was obtained. Prior Anticoagulants: The patient has                            taken no previous anticoagulant or antiplatelet                            agents except for aspirin. ASA Grade Assessment:                            III - A patient with severe systemic disease. After                            reviewing the risks and benefits, the patient was  deemed in satisfactory condition to undergo the                            procedure.                           After obtaining informed consent, the colonoscope                            was passed under direct vision. Throughout the                            procedure, the patient's blood pressure, pulse, and                            oxygen saturations were monitored continuously. The                             Colonoscope was introduced through the anus and                            advanced to the the cecum, identified by                            appendiceal orifice and ileocecal valve. The                            colonoscopy was performed without difficulty. The                            patient tolerated the procedure well. The quality                            of the bowel preparation was excellent. The                            appendiceal orifice and rectum were photographed.                            The bowel preparation used was Miralax. Scope In: 8:04:30 AM Scope Out: 8:15:20 AM Scope Withdrawal Time: 0 hours 6 minutes 56 seconds  Total Procedure Duration: 0 hours 10 minutes 50 seconds  Findings:                 The perianal and digital rectal examinations were                            normal.                           The entire examined colon appeared normal on direct                            and retroflexion views. Complications:            No immediate complications. Estimated Blood Loss:  Estimated blood loss: none. Impression:               - The entire examined colon is normal on direct and                            retroflexion views.                           - No specimens collected. Recommendation:           - Patient has a contact number available for                            emergencies. The signs and symptoms of potential                            delayed complications were discussed with the                            patient. Return to normal activities tomorrow.                            Written discharge instructions were provided to the                            patient.                           - Resume previous diet.                           - Continue present medications.                           - Repeat colonoscopy in 10 years for screening                            purposes. Kateryna Grantham L. Myrtie Neither, MD 02/09/2020 8:19:12 AM This report has  been signed electronically.

## 2020-02-09 NOTE — Progress Notes (Signed)
To PACU, VSS. Report to Rn.tb 

## 2020-02-13 ENCOUNTER — Telehealth: Payer: Self-pay

## 2020-02-13 ENCOUNTER — Telehealth: Payer: Self-pay | Admitting: *Deleted

## 2020-02-13 NOTE — Telephone Encounter (Signed)
  Follow up Call-  Call back number 02/09/2020  Post procedure Call Back phone  # [phone]  Permission to leave phone message Yes  Some recent data might be hidden     Patient questions:  Do you have a fever, pain , or abdominal swelling? No. Pain Score  0 *  Have you tolerated food without any problems? Yes.    Have you been able to return to your normal activities? Yes.    Do you have any questions about your discharge instructions: Diet   No. Medications  No. Follow up visit  No.  Do you have questions or concerns about your Care? No.  Actions: * If pain score is 4 or above: No action needed, pain <4.  1. Have you developed a fever since your procedure? no  2.   Have you had an respiratory symptoms (SOB or cough) since your procedure? no  3.   Have you tested positive for COVID 19 since your procedure no  4.   Have you had any family members/close contacts diagnosed with the COVID 19 since your procedure?  no   If yes to any of these questions please route to Laverna Peace, RN and Charlett Lango, RN

## 2020-02-13 NOTE — Telephone Encounter (Signed)
Left message on f/u call 

## 2020-02-16 ENCOUNTER — Ambulatory Visit: Payer: BC Managed Care – PPO | Admitting: Primary Care

## 2020-02-16 ENCOUNTER — Encounter: Payer: Self-pay | Admitting: Primary Care

## 2020-02-16 ENCOUNTER — Other Ambulatory Visit: Payer: Self-pay

## 2020-02-16 ENCOUNTER — Other Ambulatory Visit
Admission: RE | Admit: 2020-02-16 | Discharge: 2020-02-16 | Disposition: A | Discharge status: Home / Self Care | Payer: BC Managed Care – PPO | Source: Ambulatory Visit | Attending: Primary Care | Admitting: Primary Care

## 2020-02-16 VITALS — BP 120/68 | HR 77 | Temp 98.8°F | Ht 69.0 in | Wt 243.0 lb

## 2020-02-16 DIAGNOSIS — G4733 Obstructive sleep apnea (adult) (pediatric): Secondary | ICD-10-CM | POA: Diagnosis not present

## 2020-02-16 DIAGNOSIS — Z9989 Dependence on other enabling machines and devices: Secondary | ICD-10-CM

## 2020-02-16 DIAGNOSIS — R0602 Shortness of breath: Secondary | ICD-10-CM

## 2020-02-16 LAB — CBC WITH DIFFERENTIAL/PLATELET
Abs Immature Granulocytes: 0.01 10*3/uL (ref 0.00–0.07)
Basophils Absolute: 0 10*3/uL (ref 0.0–0.1)
Basophils Relative: 0 %
Eosinophils Absolute: 0.1 10*3/uL (ref 0.0–0.5)
Eosinophils Relative: 2 %
HCT: 36.8 % — ABNORMAL LOW (ref 39.0–52.0)
Hemoglobin: 13.4 g/dL (ref 13.0–17.0)
Immature Granulocytes: 0 %
Lymphocytes Relative: 27 %
Lymphs Abs: 1.3 10*3/uL (ref 0.7–4.0)
MCH: 31.4 pg (ref 26.0–34.0)
MCHC: 36.4 g/dL — ABNORMAL HIGH (ref 30.0–36.0)
MCV: 86.2 fL (ref 80.0–100.0)
Monocytes Absolute: 0.4 10*3/uL (ref 0.1–1.0)
Monocytes Relative: 8 %
Neutro Abs: 3 10*3/uL (ref 1.7–7.7)
Neutrophils Relative %: 63 %
Platelets: 171 10*3/uL (ref 150–400)
RBC: 4.27 MIL/uL (ref 4.22–5.81)
RDW: 13.9 % (ref 11.5–15.5)
WBC: 4.7 10*3/uL (ref 4.0–10.5)
nRBC: 0 % (ref 0.0–0.2)

## 2020-02-16 LAB — FIBRIN DERIVATIVES D-DIMER (ARMC ONLY): Fibrin derivatives D-dimer (ARMC): 373.21 ng/mL (FEU) (ref 0.00–499.00)

## 2020-02-16 LAB — SEDIMENTATION RATE: Sed Rate: 8 mm/hr (ref 0–20)

## 2020-02-16 MED ORDER — ADVAIR HFA 115-21 MCG/ACT IN AERO
2.0000 | INHALATION_SPRAY | Freq: Two times a day (BID) | RESPIRATORY_TRACT | 1 refills | Status: DC
Start: 2020-02-16 — End: 2020-03-18

## 2020-02-16 NOTE — Progress Notes (Signed)
Sob several months- going up stairs or when speak. Hard times taking deep breath. Am cough and wheezing. Back pain shoulder blade pain. He has never been tried on rescue or maintenance inhalers. He was prescribed singular- no improvement. Last smoked 30 years ago. 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 Jan 2020 sick, flu negative. No covid dx. No loss of smell or taste. Knee, ankle and hand pain. Taking Allopurinol daily, colchicine as needed for gout. He lost 25 lbs and gained it back

## 2020-02-16 NOTE — Progress Notes (Signed)
@Patient  ID: , male    DOB: January 02, 1966, 54 y.o.   MRN: [id number]  Chief Complaint  Patient presents with  . Follow-up    pt is wearing cpap 4-5hr nightly- feels pressure and mask is okay. c/o sob with exertion and at rest and prod cough with clear mucus mainly in the morning.     Referring provider: [id number], NP  HPI: 54 year old male, never smoked.  Past medical history significant for obstructive sleep apnea.  Former patient of Dr. 53, last seen by pulmonary nurse practitioner on 12/26/2019.  During last visit he complained of increased shortness of breath over the last several months.  Chest x-ray showed no acute cardiopulmonary process, lungs clear.  02/16/2020 Patient reports increased shortness of breath on exertion over the last 4 months.  He gets out of breath while speaking and walking upstairs.  He finds it difficult to take a deep breath.  He was sick in January 2020 and reports flu was negative.  He did not have Covid that he knows of.  He never lost smell or taste.  He has some morning wheezing and cough with clear production.  He reports some back pain to scapular region in the morning that goes away.  He he has not been tried on inhalers.  He has a history of gout which he takes allopurinol and as needed colchicine for.  He has pain in his wrists knees and hands.  Pulmonary function testing showed moderate restriction with no obstruction.  Total lung capacity was 66%.  Unfortunately we do not have diffusion capacity results.  He had a chest x-ray in May that showed no acute cardiopulmonary process.  He does work in [place] and is exposed to dust.  He does not wear a mask.  They do have overhead vacuum.  He has not smoked in over 30 years.  Pulmonary function testing: 01/18/2020- FVC 2.89 (60%), FEV1 2.39 (65%), ratio 77, TLC 66% He had -22% change in FEV1, no diffusion capacity done   01/20/2020 01/16/2020-02/14/2020: 25/30 days used (83%); 80%  greater than 4 hours Average usage days used 6 hours 39 minutes Pressure 10-16 cm H2O (12.6-95th percentile) Air leak 1.4 L/min (95 percentile) AHI 1.9  Allergies  Allergen Reactions  . Codeine Other (See Comments)    Nervous   . Shrimp [Shellfish Allergy]   . Sulfa Antibiotics Rash    Immunization History  Administered Date(s) Administered  . Influenza,inj,Quad PF,6+ Mos 05/18/2018  . Influenza-Unspecified 05/22/2019    Past Medical History:  Diagnosis Date  . A-fib (HCC)   . Acute gastritis with hemorrhage   . Allergy   . Cardiomegaly   . Cervical spondylosis without myelopathy   . CHF (congestive heart failure) (HCC)   . Chronic kidney disease    stage 3   . Depression   . Gout   . Hiatal hernia   . Hypertension   . Impotence of organic origin   . Leaky heart valve   . Lumbago   . Migraine without aura, without mention of intractable migraine without mention of status migrainosus   . Oxygen deficiency    on 3 Liters 02 with CPAP only   . Pure hyperglyceridemia   . Sleep apnea    wears cpap with 3 L 02 at night     Tobacco History: Social History   Tobacco Use  Smoking Status Never Smoker  Smokeless Tobacco Never Used   Counseling given: Not Answered  Outpatient Medications Prior to Visit  Medication Sig Dispense Refill  . allopurinol (ZYLOPRIM) 100 MG tablet Take 100 mg by mouth daily.     Marland Kitchen allopurinol (ZYLOPRIM) 300 MG tablet Take 300 mg by mouth daily.     Marland Kitchen aspirin EC 81 MG tablet Take 81 mg by mouth daily.    . colchicine 0.6 MG tablet TAKE 1 TABLET (0.6 MG TOTAL) BY MOUTH 2 (TWO) TIMES DAILY AS NEEDED. 60 tablet 1  . flecainide (TAMBOCOR) 50 MG tablet TAKE 1 TABLET (50 MG TOTAL) BY MOUTH 2 (TWO) TIMES DAILY AS NEEDED. 180 tablet 1  . FLUZONE QUADRIVALENT 0.5 ML injection Inject 0.5 mLs into the muscle once.  0  . furosemide (LASIX) 20 MG tablet TAKE 1 TABLET (20 MG TOTAL) BY MOUTH AS NEEDED (FOR FULLNESS AND/OR LEG SWELLING). 90 tablet 3  .  metoprolol succinate (TOPROL XL) 25 MG 24 hr tablet Take 1 tablet (25 mg total) by mouth daily. 90 tablet 3  . montelukast (SINGULAIR) 10 MG tablet Take 10 mg by mouth daily.     . NON FORMULARY CPAP DAILY    . NON FORMULARY Oxygen 3 Liters    . lisinopril-hydrochlorothiazide (ZESTORETIC) 10-12.5 MG tablet Take 1 tablet by mouth daily. 90 tablet 0   No facility-administered medications prior to visit.      Review of Systems  Review of Systems  Respiratory: Positive for cough, shortness of breath and wheezing. Negative for chest tightness.   Cardiovascular: Negative for chest pain and leg swelling.     Physical Exam  BP 120/68 (BP Location: Left Arm, Cuff Size: Normal)   Pulse 77   Temp 98.8 F (37.1 C) (Temporal)   Ht 5\' 9"  (1.753 m)   Wt 243 lb (110.2 kg)   SpO2 99%   BMI 35.88 kg/m  Physical Exam Constitutional:      Appearance: Normal appearance.  Cardiovascular:     Rate and Rhythm: Normal rate and regular rhythm.  Pulmonary:     Effort: Pulmonary effort is normal.     Breath sounds: Normal breath sounds.     Comments: Lungs clear; no overt rales or crackles. Neurological:     General: No focal deficit present.     Mental Status: He is alert and oriented to person, place, and time. Mental status is at baseline.  Psychiatric:        Mood and Affect: Mood normal.        Behavior: Behavior normal.        Thought Content: Thought content normal.        Judgment: Judgment normal.      Lab Results:  CBC    Component Value Date/Time   WBC 4.7 02/16/2020 1726   RBC 4.27 02/16/2020 1726   HGB 13.4 02/16/2020 1726   HGB 14.0 01/03/2014 1254   HCT 36.8 (L) 02/16/2020 1726   HCT 40.8 01/03/2014 1254   PLT 171 02/16/2020 1726   PLT 176 01/03/2014 1254   MCV 86.2 02/16/2020 1726   MCV 87 01/03/2014 1254   MCH 31.4 02/16/2020 1726   MCHC 36.4 (H) 02/16/2020 1726   RDW 13.9 02/16/2020 1726   RDW 13.3 01/03/2014 1254   LYMPHSABS 1.3 02/16/2020 1726   LYMPHSABS  1.9 09/29/2013 1444   MONOABS 0.4 02/16/2020 1726   EOSABS 0.1 02/16/2020 1726   EOSABS 0.1 09/29/2013 1444   BASOSABS 0.0 02/16/2020 1726   BASOSABS 0.0 09/29/2013 1444    BMET    Component  Value Date/Time   NA 137 01/03/2014 1254   K 3.9 01/03/2014 1254   CL 103 01/03/2014 1254   CO2 30 01/03/2014 1254   GLUCOSE 97 01/03/2014 1254   BUN 11 01/03/2014 1254   CREATININE 1.40 (H) 02/11/2018 0806   CREATININE 1.41 (H) 01/03/2014 1254   CALCIUM 8.8 01/03/2014 1254   GFRNONAA 58 (L) 01/03/2014 1254   GFRAA >60 01/03/2014 1254    BNP No results found for: BNP  ProBNP    Component Value Date/Time   PROBNP 6.8 09/25/2010 1414    Imaging: No results found.   Assessment & Plan:   SHORTNESS OF BREATH - Pulmonary function testing on 01/18/20 showed moderate restrictive lung disease with decreased diffusion capacity 66%. 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

## 2020-02-16 NOTE — Patient Instructions (Addendum)
Pleasure meeting you today Mr. Yaffe  Pulmonary function testing showed restrictive lung disease  Recommend we check labs today including D-dimer, CBC with differential and serology (rheumatoid labs)  Based on your labs we will decide whether or not we want to check a CTA for high resolution CAT scan  We will try you on an inhaler called Advair take 2 puffs twice daily (rinse mouth after use)  Follow-up 2-4 week televisit with [name] NP

## 2020-02-18 LAB — RHEUMATOID FACTOR: Rheumatoid fact SerPl-aCnc: <10 IU/mL (ref 0.0–13.9)

## 2020-02-19 ENCOUNTER — Other Ambulatory Visit: Payer: Self-pay | Admitting: Family

## 2020-02-19 ENCOUNTER — Other Ambulatory Visit: Payer: Self-pay | Admitting: Primary Care

## 2020-02-19 DIAGNOSIS — R0602 Shortness of breath: Secondary | ICD-10-CM

## 2020-02-19 LAB — ANA: Anti Nuclear Antibody (ANA): NEGATIVE

## 2020-02-19 NOTE — Assessment & Plan Note (Signed)
-   Pulmonary function testing on 01/18/20 showed moderate restrictive lung disease with decreased diffusion capacity 66%. 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

## 2020-02-19 NOTE — Progress Notes (Signed)
Spoke with pt and notified of results per BW. Pt verbalized understanding and denied any questions. CTA ordered.

## 2020-02-19 NOTE — Progress Notes (Signed)
Please let patient know his labs look normal, reassuring. Still needs CT imaging

## 2020-02-19 NOTE — Progress Notes (Signed)
Please order CTA re: shortness of breath (does not need to be stat)

## 2020-02-19 NOTE — Assessment & Plan Note (Signed)
-   Patient is 80% compliant with CPAP and reports benefit from use - Pressure 10-16cm h20; AHI 1.9 - No changes today - Patient to continue to wear CPAP for 4-6 hours or more each night

## 2020-03-06 ENCOUNTER — Telehealth: Payer: Self-pay | Admitting: Primary Care

## 2020-03-06 ENCOUNTER — Ambulatory Visit: Payer: BC Managed Care – PPO

## 2020-03-06 NOTE — Telephone Encounter (Signed)
Called and spoke with wife Randy Schneider about upcoming CT scan on 03/08/20 and TELEVISIT on 03/11/20 with Randy Schneider. Gave her the information for CT and told her that [name] should have it back by the 9th to go over with patient. She expressed understanding. Nothing further needed at this time.

## 2020-03-08 ENCOUNTER — Ambulatory Visit: Payer: BC Managed Care – PPO | Admitting: Primary Care

## 2020-03-08 ENCOUNTER — Ambulatory Visit
Admission: RE | Admit: 2020-03-08 | Discharge: 2020-03-08 | Disposition: A | Discharge status: Home / Self Care | Payer: BC Managed Care – PPO | Source: Ambulatory Visit | Attending: Primary Care | Admitting: Primary Care

## 2020-03-08 ENCOUNTER — Other Ambulatory Visit: Payer: Self-pay

## 2020-03-08 DIAGNOSIS — R0602 Shortness of breath: Secondary | ICD-10-CM | POA: Diagnosis not present

## 2020-03-08 HISTORY — DX: Disorder of kidney and ureter, unspecified: N28.9

## 2020-03-08 LAB — POCT I-STAT CREATININE: Creatinine, Ser: 1.7 mg/dL — ABNORMAL HIGH (ref 0.61–1.24)

## 2020-03-08 MED ORDER — IOHEXOL 350 MG/ML SOLN
60.0000 mL | Freq: Once | INTRAVENOUS | Status: AC | PRN
  Administered 2020-03-08: 60 mL via INTRAVENOUS

## 2020-03-11 ENCOUNTER — Encounter: Payer: Self-pay | Admitting: Primary Care

## 2020-03-11 ENCOUNTER — Ambulatory Visit (INDEPENDENT_AMBULATORY_CARE_PROVIDER_SITE_OTHER): Payer: BC Managed Care – PPO | Admitting: Primary Care

## 2020-03-11 ENCOUNTER — Other Ambulatory Visit: Payer: Self-pay

## 2020-03-11 DIAGNOSIS — R0600 Dyspnea, unspecified: Secondary | ICD-10-CM

## 2020-03-11 NOTE — Progress Notes (Signed)
Reviewed and agree with assessment/plan.   Coralyn Helling, MD [facility] 03/11/2020, 6:03 PM Pager:  [phone]

## 2020-03-11 NOTE — Progress Notes (Signed)
Virtual Visit via Telephone Note  I connected with [name] on 03/11/20 at  4:30 PM EDT by telephone and verified that I am speaking with the correct person using two identifiers.  Location: Patient: Home Provider: Office   I discussed the limitations, risks, security and privacy concerns of performing an evaluation and management service by telephone and the availability of in person appointments. I also discussed with the patient that there may be a patient responsible charge related to this service. The patient expressed understanding and agreed to proceed.   History of Present Illness: 54 year old male, never smoked.  Past medical history significant for obstructive sleep apnea, afib.  Former patient of Dr. Nicholos Johns, last seen by pulmonary nurse practitioner on 12/26/2019.  During last visit he complained of increased shortness of breath over the last several months.  Chest x-ray showed no acute cardiopulmonary process, lungs clear.  Previous [facility] pulmonary encounter:  02/16/2020 Patient reports increased shortness of breath on exertion over the last 4 months.  He gets out of breath while speaking and walking upstairs.  He finds it difficult to take a deep breath.  He was sick in January 2020 and reports flu was negative.  He did not have Covid that he knows of.  He never lost smell or taste.  He has some morning wheezing and cough with clear production.  He reports some back pain to scapular region in the morning that goes away.  He he has not been tried on inhalers.  He has a history of gout which he takes allopurinol and as needed colchicine for.  He has pain in his wrists knees and hands.  Pulmonary function testing showed moderate restriction with no obstruction.  Total lung capacity was 66%.  Unfortunately we do not have diffusion capacity results.  He had a chest x-ray in May that showed no acute cardiopulmonary process.  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  He does not  wear a mask.  They do have overhead vacuum.  He has not smoked in over 30 years.  03/11/2020- Interim hx Patient contacted today for 3-4 week follow-up. CTA on 03/08/20 showed no evidence of pulmonary embolism, no acute findings chest. It did show aortic atherosclerosis. PFTs in June showed moderate restriction without bronchodilator response. He has morning wheezing and cough. No improvement on Advair 115/21. He has a follow-up with Cardiology/ Dr. Mariah Milling in September scheduled. He had cardiac catheterization in 2015. Patient also follows with nephrology for stage 3 kidney disease. He is compliant with CPAP but reports no benefit from use.   Pulmonary function testing: 01/18/2020- FVC 2.89 (60%), FEV1 2.39 (65%), ratio 77, TLC 66% He had -22% change in FEV1, no diffusion capacity done   Imaging: 03/08/20 CTA- no evidence of PE. Bandlike area of atelectasis or scarring in the lingula. No consolidation. No pleural effusion. Airways are patent. No acute findings in chest. Aortic atherosclerosis.   Airview download 02/10/20- 03/10/20: 30/30 days used (100%); 100%  > 4 hours Pressure 10-16cm h20 AHI 1.2    Observations/Objective:  - Able to speak in full sentences without over wheezing/shortness of breath  Assessment and Plan:  Dyspnea - At this time, there is no clear pulmonary cause for dyspnea symptoms. PFTs showed moderate restriction without BD response. CTA was negative for PE with no acute findings. He had no noticeable improvement with medium dose ICS/LABA. Recommend following up with cardiology and nephrology for additional work-up.  - If continues to have  symptoms would recommend either increasing Advair to full strength dose, check methacholine challenge to definitely rule in/out asthma and/or get HRCT to rule out fibrosis.   Follow Up Instructions:   - Follow-up with Dr. Belia Heman or Jayme Cloud if symptoms continue   I discussed the assessment and treatment plan with the patient. The patient  was provided an opportunity to ask questions and all were answered. The patient agreed with the plan and demonstrated an understanding of the instructions.   The patient was advised to call back or seek an in-person evaluation if the symptoms worsen or if the condition fails to improve as anticipated.  I provided 25 minutes of non-face-to-face time during this encounter.   Glenford Bayley, NP

## 2020-03-11 NOTE — Patient Instructions (Signed)
PFTs showed moderate restriction without BD response.  CTA was negative for PE with no acute findings.  Recommend following up with cardiology and nephrology for additional work-up Follow-up with Dr. Belia Heman or Jayme Cloud if symptoms continue

## 2020-03-18 ENCOUNTER — Other Ambulatory Visit: Payer: Self-pay

## 2020-03-18 ENCOUNTER — Encounter: Payer: Self-pay | Admitting: *Deleted

## 2020-03-18 ENCOUNTER — Telehealth: Payer: Self-pay | Admitting: Cardiovascular Disease

## 2020-03-18 ENCOUNTER — Encounter: Payer: Self-pay | Admitting: Cardiovascular Disease

## 2020-03-18 ENCOUNTER — Ambulatory Visit: Payer: BC Managed Care – PPO | Admitting: Cardiovascular Disease

## 2020-03-18 VITALS — BP 118/80 | HR 68 | Ht 69.0 in | Wt 244.0 lb

## 2020-03-18 DIAGNOSIS — I480 Paroxysmal atrial fibrillation: Secondary | ICD-10-CM | POA: Diagnosis not present

## 2020-03-18 NOTE — Telephone Encounter (Signed)
Scheduled from waitlist today Dr. Mariah Milling at 1140

## 2020-03-18 NOTE — Telephone Encounter (Signed)
Patient had recent ct and was told to fu with gollan due to findings .    Scheduled in October and added to waitlist as patient declines app

## 2020-03-18 NOTE — Progress Notes (Signed)
Date:  03/18/2020   ID:  Randy Schneider, DOB 01/10/1966, MRN [id number]  Patient Location:  [address] [place] [address]   Provider location:   [facility], [place] office  PCP:  Ronal Fear, NP  Cardiologist:  Fonnie Mu   Chief Complaint  Patient presents with  . other    4 month follow up. Pt. c/o chest feeling sore to the touch when he wakes up in the morning, pain in right leg with walking, some chest pain on Saturday, Aug. 15th that radiated to his left shoudler and shortness of breath with occas. LE edema.      History of Present Illness:    Randy Schneider is a 54 y.o. male past medical history of paroxysmal atrial fibrillation,  chronic chest pain,  chronic neck pain ,  depression,  obstructive sleep apnea who wear CPAP,  migraines,  shortness of breath starting back >5 years ago  cardiac catheterization in March 2013 showing no significant coronary artery disease. He is seen at [facility] medical clinic. Chronic fatigue,  who presents for routine of his shortness of breath, atrial fibrillation.   Last seen by myself in clinic August 2018 Seen by one of our other providers April 2021, 2 visits  Underwent a Myoview April 2021, low risk study Echocardiogram performed ejection fraction 60 to 65%  Underwent a ZIO monitor for near syncope, no significant arrhythmia noted Was started on metoprolol  Has seen pulmonary, Chronic shortness of breath dating back 10 years, etiology unclear Shortness of breath worse with exertion  OSA on CPAP  Looking forward to retiring in 6 years time  Had CT scan: for SOB Images pulled up and reviewed, minimal aortic atherosclerosis, no coronary calcification noted  Stable but running high Atypical chest pain, hurts on palpation of left chest rib area Worse when he is sleeping  Normal renal function August 2020 No significant leg swelling, no regular exercise Non-smoker  Reports  inhalers did not help his breathing  EKG personally reviewed by myself on todays visit Shows normal sinus rhythm rate 69 bpm no significant ST or T wave changes  Other hx reviewed One episode of atrial fibrillation approximately 6 to 8 months ago Sleeping the couch , wife for him to get into bed, startled him, went into atrial  Symptoms lasted 30 minutes, took flecainide 2.    Prior CV studies:   The following studies were reviewed today:    Past Medical History:  Diagnosis Date  . A-fib (HCC)   . Acute gastritis with hemorrhage   . Allergy   . Cardiomegaly   . Cervical spondylosis without myelopathy   . CHF (congestive heart failure) (HCC)   . Chronic kidney disease    stage 3   . Depression   . Gout   . Hiatal hernia   . Hypertension   . Impotence of organic origin   . Leaky heart valve   . Lumbago   . Migraine without aura, without mention of intractable migraine without mention of status migrainosus   . Oxygen deficiency    on 3 Liters 02 with CPAP only   . Pure hyperglyceridemia   . Renal insufficiency   . Sleep apnea    wears cpap with 3 L 02 at night    Past Surgical History:  Procedure Laterality Date  . BACK SURGERY  1999  . CARPAL TUNNEL RELEASE Right 1994  . COLONOSCOPY  age 74 for bleeding - normal   . NASAL SINUS SURGERY  2012  . ROTATOR CUFF REPAIR Left 2003  . UPPER GASTROINTESTINAL ENDOSCOPY     age 77- for bleeding      Current Meds  Medication Sig  . allopurinol (ZYLOPRIM) 100 MG tablet Take 100 mg by mouth daily.   Marland Kitchen allopurinol (ZYLOPRIM) 300 MG tablet Take 300 mg by mouth daily.   Marland Kitchen aspirin EC 81 MG tablet Take 81 mg by mouth daily.  . colchicine 0.6 MG tablet TAKE 1 TABLET (0.6 MG TOTAL) BY MOUTH 2 (TWO) TIMES DAILY AS NEEDED.  . flecainide (TAMBOCOR) 50 MG tablet TAKE 1 TABLET (50 MG TOTAL) BY MOUTH 2 (TWO) TIMES DAILY AS NEEDED.  Marland Kitchen FLUZONE QUADRIVALENT 0.5 ML injection Inject 0.5 mLs into the muscle once.  . furosemide (LASIX)  20 MG tablet TAKE 1 TABLET (20 MG TOTAL) BY MOUTH AS NEEDED (FOR FULLNESS AND/OR LEG SWELLING).  Marland Kitchen lisinopril-hydrochlorothiazide (ZESTORETIC) 10-12.5 MG tablet TAKE 1 TABLET BY MOUTH EVERY DAY  . metoprolol succinate (TOPROL XL) 25 MG 24 hr tablet Take 1 tablet (25 mg total) by mouth daily.  . NON FORMULARY CPAP DAILY  . NON FORMULARY Oxygen 3 Liters     Allergies:   Codeine, Shrimp [shellfish allergy], and Sulfa antibiotics   Social History   Tobacco Use  . Smoking status: Never Smoker  . Smokeless tobacco: Never Used  Vaping Use  . Vaping Use: Never used  Substance Use Topics  . Alcohol use: Yes    Comment: very rare   . Drug use: Not Currently    Types: Marijuana    Comment: 30 yrs     Family Hx: The patient's family history includes Arrhythmia in his brother and sister; Heart attack in his father and mother; Heart disease in his brother; Heart failure in his father; Leukemia in his mother. There is no history of Colon polyps, Colon cancer, Esophageal cancer, Stomach cancer, or Rectal cancer.  ROS:   Please see the history of present illness.    Review of Systems  Constitutional: Negative.   HENT: Negative.   Respiratory: Positive for shortness of breath.   Cardiovascular: Negative.   Gastrointestinal: Negative.   Musculoskeletal: Negative.   Neurological: Negative.   Psychiatric/Behavioral: Negative.   All other systems reviewed and are negative.    Labs/Other Tests and Data Reviewed:    Recent Labs: 02/16/2020: Hemoglobin 13.4; Platelets 171 03/08/2020: Creatinine, Ser 1.70   Recent Lipid Panel No results found for: CHOL, TRIG, HDL, CHOLHDL, LDLCALC, LDLDIRECT  Wt Readings from Last 3 Encounters:  03/18/20 244 lb (110.7 kg)  02/16/20 243 lb (110.2 kg)  02/09/20 241 lb (109.3 kg)     Exam:    Vital Signs: Vital signs may also be detailed in the HPI BP 118/80 (BP Location: Left Arm, Patient Position: Sitting, Cuff Size: Large)   Pulse 68   Ht 5\' 9"  (1.753  m)   Wt 244 lb (110.7 kg)   SpO2 99%   BMI 36.03 kg/m   Constitutional:  oriented to person, place, and time. No distress.  HENT:  Head: Normocephalic and atraumatic.  Eyes:  no discharge. No scleral icterus.  Neck: Normal range of motion. Neck supple. No JVD present.  Cardiovascular: Normal rate, regular rhythm, normal heart sounds and intact distal pulses. Exam reveals no gallop and no friction rub. No edema No murmur heard. Pulmonary/Chest: Effort normal and breath sounds normal. No stridor. No respiratory distress.  no  wheezes.  no rales.  no tenderness.  Abdominal: Soft.  no distension.  no tenderness.  Musculoskeletal: Normal range of motion.  no  tenderness or deformity.  Neurological:  normal muscle tone. Coordination normal. No atrophy Skin: Skin is warm and dry. No rash noted. not diaphoretic.  Psychiatric:  normal mood and affect. behavior is normal. Thought content normal.    ASSESSMENT & PLAN:    Paroxysmal atrial fibrillation (HCC) -  CHADS VASC is low, Has flecainide listed on his medications  to take as needed for any paroxysmal atrial fibrillation No recent episodes noted  Essential hypertension, benign - Blood pressure stable, no medication changes made  Shortness of breath -  Long discussion with him concerning prior testing No further work-up at this time Lung works was offered to the hospital, he has declined at this time He did not note any improvement in his symptoms with prior weight loss Has seen pulmonary  Chest pain, unspecified type -  Rib pain on the left, might need chiropractic  OSA on CPAP On CPAP Followed by pulmonary  CRI Stable renal function August 2020 Would avoid overdiuresis   Total encounter time more than 25 minutes  Greater than 50% was spent in counseling and coordination of care with the patient    Signed, Julien Nordmann, MD  03/18/2020 12:19 PM    [facility] [address], [place], [place] [address]

## 2020-03-18 NOTE — Patient Instructions (Signed)

## 2020-03-19 NOTE — Telephone Encounter (Signed)
Pt had appointment with provider yesterday.

## 2020-03-31 LAB — PULMONARY FUNCTION TEST ARMC ONLY
DL/VA % pred: 168 %
DL/VA: 7.31 ml/min/mmHg/L
DLCO unc % pred: 71 %
DLCO unc: 19.84 ml/min/mmHg
FEF 25-75 Post: 0.64 L/sec
FEF 25-75 Pre: 2.66 L/sec
FEF2575-%Change-Post: -75 %
FEF2575-%Pred-Post: 20 %
FEF2575-%Pred-Pre: 84 %
FEV1-%Change-Post: -22 %
FEV1-%Pred-Post: 50 %
FEV1-%Pred-Pre: 65 %
FEV1-Post: 1.85 L
FEV1-Pre: 2.39 L
FEV1FVC-%Change-Post: -2 %
FEV1FVC-%Pred-Pre: 107 %
FEV6-%Change-Post: -23 %
FEV6-%Pred-Post: 48 %
FEV6-%Pred-Pre: 63 %
FEV6-Post: 2.21 L
FEV6-Pre: 2.89 L
FEV6FVC-%Pred-Post: 104 %
FEV6FVC-%Pred-Pre: 104 %
FVC-%Change-Post: -20 %
FVC-%Pred-Post: 48 %
FVC-%Pred-Pre: 60 %
FVC-Post: 2.29 L
Post FEV1/FVC ratio: 81 %
Post FEV6/FVC ratio: 100 %
Pre FEV1/FVC ratio: 83 %
Pre FEV6/FVC Ratio: 100 %
RV % pred: 73 %
RV: 1.53 L
TLC % pred: 66 %
TLC: 4.47 L

## 2020-04-05 ENCOUNTER — Ambulatory Visit: Payer: BC Managed Care – PPO | Admitting: Physician Assistant

## 2020-04-19 DIAGNOSIS — I10 Essential (primary) hypertension: Secondary | ICD-10-CM | POA: Diagnosis not present

## 2020-04-19 DIAGNOSIS — I4891 Unspecified atrial fibrillation: Secondary | ICD-10-CM | POA: Diagnosis not present

## 2020-04-19 DIAGNOSIS — Z23 Encounter for immunization: Secondary | ICD-10-CM | POA: Diagnosis not present

## 2020-04-19 DIAGNOSIS — E785 Hyperlipidemia, unspecified: Secondary | ICD-10-CM | POA: Diagnosis not present

## 2020-04-19 DIAGNOSIS — R7301 Impaired fasting glucose: Secondary | ICD-10-CM | POA: Diagnosis not present

## 2020-04-19 DIAGNOSIS — M109 Gout, unspecified: Secondary | ICD-10-CM | POA: Diagnosis not present

## 2020-04-19 DIAGNOSIS — N1831 Chronic kidney disease, stage 3a: Secondary | ICD-10-CM | POA: Diagnosis not present

## 2020-04-30 DIAGNOSIS — I4891 Unspecified atrial fibrillation: Secondary | ICD-10-CM | POA: Diagnosis not present

## 2020-04-30 DIAGNOSIS — G4733 Obstructive sleep apnea (adult) (pediatric): Secondary | ICD-10-CM | POA: Diagnosis not present

## 2020-04-30 DIAGNOSIS — N1831 Chronic kidney disease, stage 3a: Secondary | ICD-10-CM | POA: Diagnosis not present

## 2020-04-30 DIAGNOSIS — I10 Essential (primary) hypertension: Secondary | ICD-10-CM | POA: Diagnosis not present

## 2020-05-21 ENCOUNTER — Ambulatory Visit: Payer: BC Managed Care – PPO | Admitting: Cardiovascular Disease

## 2020-06-18 DIAGNOSIS — I4891 Unspecified atrial fibrillation: Secondary | ICD-10-CM | POA: Diagnosis not present

## 2020-06-18 DIAGNOSIS — M109 Gout, unspecified: Secondary | ICD-10-CM | POA: Diagnosis not present

## 2020-06-18 DIAGNOSIS — N1831 Chronic kidney disease, stage 3a: Secondary | ICD-10-CM | POA: Diagnosis not present

## 2020-06-18 DIAGNOSIS — I10 Essential (primary) hypertension: Secondary | ICD-10-CM | POA: Diagnosis not present

## 2020-07-29 ENCOUNTER — Other Ambulatory Visit: Payer: Self-pay | Admitting: Cardiovascular Disease

## 2020-09-19 ENCOUNTER — Other Ambulatory Visit: Payer: Self-pay | Admitting: Cardiovascular Disease

## 2020-11-08 DIAGNOSIS — M109 Gout, unspecified: Secondary | ICD-10-CM | POA: Diagnosis not present

## 2020-11-08 DIAGNOSIS — Z125 Encounter for screening for malignant neoplasm of prostate: Secondary | ICD-10-CM | POA: Diagnosis not present

## 2020-11-08 DIAGNOSIS — Z0001 Encounter for general adult medical examination with abnormal findings: Secondary | ICD-10-CM | POA: Diagnosis not present

## 2020-11-08 DIAGNOSIS — N1831 Chronic kidney disease, stage 3a: Secondary | ICD-10-CM | POA: Diagnosis not present

## 2020-11-08 DIAGNOSIS — G4733 Obstructive sleep apnea (adult) (pediatric): Secondary | ICD-10-CM | POA: Diagnosis not present

## 2020-11-08 DIAGNOSIS — I10 Essential (primary) hypertension: Secondary | ICD-10-CM | POA: Diagnosis not present

## 2020-11-08 DIAGNOSIS — Z13220 Encounter for screening for lipoid disorders: Secondary | ICD-10-CM | POA: Diagnosis not present

## 2020-12-06 ENCOUNTER — Other Ambulatory Visit: Payer: Self-pay

## 2020-12-06 ENCOUNTER — Encounter: Payer: Self-pay | Admitting: Primary Care

## 2020-12-06 ENCOUNTER — Ambulatory Visit: Payer: BC Managed Care – PPO | Admitting: Primary Care

## 2020-12-06 VITALS — BP 124/70 | HR 71 | Temp 97.1°F | Ht 69.0 in | Wt 247.2 lb

## 2020-12-06 DIAGNOSIS — G4733 Obstructive sleep apnea (adult) (pediatric): Secondary | ICD-10-CM | POA: Diagnosis not present

## 2020-12-06 DIAGNOSIS — Z9989 Dependence on other enabling machines and devices: Secondary | ICD-10-CM

## 2020-12-06 DIAGNOSIS — J9611 Chronic respiratory failure with hypoxia: Secondary | ICD-10-CM | POA: Diagnosis not present

## 2020-12-06 DIAGNOSIS — R0609 Other forms of dyspnea: Secondary | ICD-10-CM

## 2020-12-06 DIAGNOSIS — R0600 Dyspnea, unspecified: Secondary | ICD-10-CM

## 2020-12-06 NOTE — Progress Notes (Signed)
@Patient  ID: , male    DOB: [birth year], 55 y.o.   MRN: [id number]  Chief Complaint  Patient presents with  . Follow-up    Pt states that he is still having a lot of problems with SOB. States that it is worse at night when sleeping even with the CPAP on.    Referring provider: [id number], NP  HPI: 55 year old male, never smoked.  Past medical history significant for obstructive sleep apnea, afib.  Former patient of Dr. 53, last seen by pulmonary nurse practitioner on 12/26/2019.  During last visit he complained of increased shortness of breath over the last several months.  Chest x-ray showed no acute cardiopulmonary process, lungs clear.  Previous [facility] pulmonary encounter:  02/16/2020 Patient reports increased shortness of breath on exertion over the last 4 months.  He gets out of breath while speaking and walking upstairs.  He finds it difficult to take a deep breath.  He was sick in January 2020 and reports flu was negative.  He did not have Covid that he knows of.  He never lost smell or taste.  He has some morning wheezing and cough with clear production.  He reports some back pain to scapular region in the morning that goes away.  He he has not been tried on inhalers.  He has a history of gout which he takes allopurinol and as needed colchicine for.  He has pain in his wrists knees and hands.  Pulmonary function testing showed moderate restriction with no obstruction.  Total lung capacity was 66%.  Unfortunately we do not have diffusion capacity results.  He had a chest x-ray in May that showed no acute cardiopulmonary process.  He does work in June and is exposed to dust.  He does not wear a mask.  They do have overhead vacuum.  He has not smoked in over 30 years.  03/11/2020 Patient contacted today for 3-4 week follow-up. CTA on 03/08/20 showed no evidence of pulmonary embolism, no acute findings chest. It did show aortic atherosclerosis. PFTs in June showed moderate  restriction without bronchodilator response. He has morning wheezing and cough. No improvement on Advair 115/21. He has a follow-up with Cardiology/ Dr. July in September scheduled. He had cardiac catheterization in 2015. Patient also follows with nephrology for stage 3 kidney disease. He is compliant with CPAP but reports no benefit from use.   12/06/2020 - Interim hx Patient presents today for annual follow-up. He feels he should be getting better sleep on CPAP therapy. Continues to report daytime fatigue and still wakes up snoring on occasion. He wears 3L oxygen at night. He has persistent shortness of breath symptoms through the day. Hx marijuana use, quit >20 years ago. No tobacco smoke. PFTs showed moderate restriction without BD response. CTA in August 2021 showed atelectasis or scarring in lingula, no PE. He does have some work exposure to September 2021. He has stage 3 kidney disease, not on HD.    Airview download 11/05/20-12/04/20 Usage 30/30 days (100%); 29 days (97%) > 4 hours Average usage 6 hours 58 mins Pressure 10-16cm h20 Airleaks 12.3L/min AHI 1.1  Pulmonary function testing: 09/06/15- FVC 4.24 (87%0, FEV1 3.67 (97%), ratio 87, RV 50%, DLCOunc 28.73 (92%)  01/18/2020- FVC 2.89 (60%), FEV1 2.39 (65%), ratio 77, TLC 66% He had -22% change in FEV1, no diffusion capacity done   Imaging: 03/08/20 CTA- no evidence of PE. Bandlike area of atelectasis or scarring in the lingula. No consolidation. No pleural  effusion. Airways are patent. No acute findings in chest. Aortic atherosclerosis.   Airview download 02/10/20- 03/10/20: 30/30 days used (100%); 100%  > 4 hours Pressure 10-16cm h20 AHI 1.2   Allergies  Allergen Reactions  . Codeine Other (See Comments)    Nervous   . Shrimp [Shellfish Allergy]   . Sulfa Antibiotics Rash    Immunization History  Administered Date(s) Administered  . Influenza,inj,Quad PF,6+ Mos 05/18/2018  . Influenza-Unspecified 05/22/2019    Past Medical  History:  Diagnosis Date  . A-fib (HCC)   . Acute gastritis with hemorrhage   . Allergy   . Cardiomegaly   . Cervical spondylosis without myelopathy   . CHF (congestive heart failure) (HCC)   . Chronic kidney disease    stage 3   . Depression   . Gout   . Hiatal hernia   . Hypertension   . Impotence of organic origin   . Leaky heart valve   . Lumbago   . Migraine without aura, without mention of intractable migraine without mention of status migrainosus   . Oxygen deficiency    on 3 Liters 02 with CPAP only   . Pure hyperglyceridemia   . Renal insufficiency   . Sleep apnea    wears cpap with 3 L 02 at night     Tobacco History: Social History   Tobacco Use  Smoking Status Never Smoker  Smokeless Tobacco Never Used   Counseling given: Not Answered   Outpatient Medications Prior to Visit  Medication Sig Dispense Refill  . allopurinol (ZYLOPRIM) 100 MG tablet Take 100 mg by mouth daily.     Marland Kitchen allopurinol (ZYLOPRIM) 300 MG tablet Take 300 mg by mouth daily.     Marland Kitchen aspirin EC 81 MG tablet Take 81 mg by mouth daily.    . colchicine 0.6 MG tablet TAKE 1 TABLET (0.6 MG TOTAL) BY MOUTH 2 (TWO) TIMES DAILY AS NEEDED. 60 tablet 1  . flecainide (TAMBOCOR) 50 MG tablet TAKE 1 TABLET (50 MG TOTAL) BY MOUTH 2 (TWO) TIMES DAILY AS NEEDED. 180 tablet 1  . furosemide (LASIX) 20 MG tablet TAKE 1 TABLET (20 MG TOTAL) BY MOUTH AS NEEDED (FOR FULLNESS AND/OR LEG SWELLING). 90 tablet 3  . lisinopril-hydrochlorothiazide (ZESTORETIC) 10-12.5 MG tablet TAKE 1 TABLET BY MOUTH EVERY DAY 90 tablet 2  . metoprolol succinate (TOPROL XL) 25 MG 24 hr tablet Take 1 tablet (25 mg total) by mouth daily. 90 tablet 3  . NON FORMULARY CPAP DAILY    . NON FORMULARY Oxygen 3 Liters bled into CPAP    . FLUZONE QUADRIVALENT 0.5 ML injection Inject 0.5 mLs into the muscle once.  0   No facility-administered medications prior to visit.   Review of Systems  Review of Systems  Constitutional: Positive for  fatigue.  Respiratory: Positive for shortness of breath.    Physical Exam  BP 124/70 (BP Location: Left Arm, Patient Position: Sitting, Cuff Size: Large)   Pulse 71   Temp (!) 97.1 F (36.2 C) (Temporal)   Ht 5\' 9"  (1.753 m)   Wt 247 lb 3.2 oz (112.1 kg)   SpO2 99% Comment: RA  BMI 36.51 kg/m  Physical Exam Constitutional:      General: He is not in acute distress.    Appearance: Normal appearance. He is obese. He is not ill-appearing, toxic-appearing or diaphoretic.  HENT:     Mouth/Throat:     Comments: Deferred d/t masking Cardiovascular:     Rate and  Rhythm: Normal rate and regular rhythm.  Pulmonary:     Effort: Pulmonary effort is normal.     Breath sounds: Normal breath sounds. No wheezing, rhonchi or rales.     Comments: CTA Musculoskeletal:        General: Normal range of motion.  Skin:    General: Skin is warm and dry.  Neurological:     General: No focal deficit present.     Mental Status: He is alert and oriented to person, place, and time. Mental status is at baseline.  Psychiatric:        Thought Content: Thought content normal.        Judgment: Judgment normal.     Comments: Flat affect      Lab Results:  CBC    Component Value Date/Time   WBC 4.7 02/16/2020 1726   RBC 4.27 02/16/2020 1726   HGB 13.4 02/16/2020 1726   HGB 14.0 01/03/2014 1254   HCT 36.8 (L) 02/16/2020 1726   HCT 40.8 01/03/2014 1254   PLT 171 02/16/2020 1726   PLT 176 01/03/2014 1254   MCV 86.2 02/16/2020 1726   MCV 87 01/03/2014 1254   MCH 31.4 02/16/2020 1726   MCHC 36.4 (H) 02/16/2020 1726   RDW 13.9 02/16/2020 1726   RDW 13.3 01/03/2014 1254   LYMPHSABS 1.3 02/16/2020 1726   LYMPHSABS 1.9 09/29/2013 1444   MONOABS 0.4 02/16/2020 1726   EOSABS 0.1 02/16/2020 1726   EOSABS 0.1 09/29/2013 1444   BASOSABS 0.0 02/16/2020 1726   BASOSABS 0.0 09/29/2013 1444    BMET    Component Value Date/Time   NA 137 01/03/2014 1254   K 3.9 01/03/2014 1254   CL 103 01/03/2014  1254   CO2 30 01/03/2014 1254   GLUCOSE 97 01/03/2014 1254   BUN 11 01/03/2014 1254   CREATININE 1.70 (H) 03/08/2020 1110   CREATININE 1.41 (H) 01/03/2014 1254   CALCIUM 8.8 01/03/2014 1254   GFRNONAA 58 (L) 01/03/2014 1254   GFRAA >60 01/03/2014 1254    BNP No results found for: BNP  ProBNP    Component Value Date/Time   PROBNP 6.8 09/25/2010 1414    Imaging: No results found.   Assessment & Plan:   OSA on CPAP - Continues to have significant daytime fatigue, not sleeping well despite being compliant with CPAP. He reports waking up snoring and continues to experience apnea  - Pressure 1-16cm h20; Residual AHI 1.1 - Recommend CPAP titration study, if OSA well controlled may consider adding medication for fatigue such as provigil   Dyspnea - PFTs showed moderate restriction without BD response. CTA in August 2021 showed atelectasis or scarring in lingula, no PE. He does have some work exposure to [employer] and hx marijuana use. - Referring to Dr. Jayme Cloud for pulmonary consult, scheduled for May 12th at 1:15pm   Glenford Bayley, NP 12/06/2020

## 2020-12-06 NOTE — Patient Instructions (Signed)
Orders: CPAP titration study re: OSA/ chronic respiratory failure  Refer: Pulmonary consult with Dr. Jayme Cloud re: dyspnea   Follow-up: 3 months with Beth for OSA [facility])

## 2020-12-06 NOTE — Assessment & Plan Note (Addendum)
-   PFTs showed moderate restriction without BD response. CTA in August 2021 showed atelectasis or scarring in lingula, no PE. He does have some work exposure to [employer] and hx marijuana use. - Referring to Dr. Jayme Cloud for pulmonary consult, scheduled for May 12th at 1:15pm

## 2020-12-06 NOTE — Assessment & Plan Note (Addendum)
-   Continues to have significant daytime fatigue, not sleeping well despite being compliant with CPAP. He reports waking up snoring and continues to experience apnea  - Pressure 1-16cm h20; Residual AHI 1.1 - Recommend CPAP titration study, if OSA well controlled may consider adding medication for fatigue such as provigil

## 2020-12-09 NOTE — Progress Notes (Signed)
Findings noted.  Patient to have consultation with me on 12 Dec 2020 for dyspnea.  Agree with the details of the visit as noted by Ames Dura, NP.  Gailen Shelter, MD [facility]

## 2020-12-12 ENCOUNTER — Ambulatory Visit (INDEPENDENT_AMBULATORY_CARE_PROVIDER_SITE_OTHER): Payer: BC Managed Care – PPO | Admitting: Pulmonary Disease

## 2020-12-12 ENCOUNTER — Other Ambulatory Visit: Payer: Self-pay

## 2020-12-12 ENCOUNTER — Encounter: Payer: Self-pay | Admitting: Pulmonary Disease

## 2020-12-12 VITALS — BP 128/74 | HR 75 | Temp 97.3°F | Ht 69.0 in | Wt 243.8 lb

## 2020-12-12 DIAGNOSIS — I340 Nonrheumatic mitral (valve) insufficiency: Secondary | ICD-10-CM

## 2020-12-12 DIAGNOSIS — I480 Paroxysmal atrial fibrillation: Secondary | ICD-10-CM

## 2020-12-12 DIAGNOSIS — R0602 Shortness of breath: Secondary | ICD-10-CM | POA: Diagnosis not present

## 2020-12-12 DIAGNOSIS — G4733 Obstructive sleep apnea (adult) (pediatric): Secondary | ICD-10-CM | POA: Diagnosis not present

## 2020-12-12 DIAGNOSIS — N1831 Chronic kidney disease, stage 3a: Secondary | ICD-10-CM | POA: Diagnosis not present

## 2020-12-12 DIAGNOSIS — E669 Obesity, unspecified: Secondary | ICD-10-CM

## 2020-12-12 DIAGNOSIS — I10 Essential (primary) hypertension: Secondary | ICD-10-CM | POA: Diagnosis not present

## 2020-12-12 DIAGNOSIS — I5189 Other ill-defined heart diseases: Secondary | ICD-10-CM | POA: Diagnosis not present

## 2020-12-12 DIAGNOSIS — N400 Enlarged prostate without lower urinary tract symptoms: Secondary | ICD-10-CM | POA: Diagnosis not present

## 2020-12-12 NOTE — Patient Instructions (Signed)
We are ordering a heart test to evaluate your leaky valves and stiffness of the heart.   We are getting another sleep study as I suspect you need more than just CPAP to control your sleep apnea.   We will see you in follow-up in 4 to 6 weeks time with either me or one of the nurse practitioner

## 2020-12-12 NOTE — Progress Notes (Signed)
Subjective:    Patient ID: Randy Schneider, male    DOB: 09-16-1965, 55 y.o.   MRN: [id number] Chief Complaint  Patient presents with  . pulmonary consult    C/o sob with exertion and prod cough with clear sputum mainly in the morning x10y   HPI The patient is a 55 year old lifelong never smoker who presents for the issue of dyspnea.  He recently saw our nurse practitioner Ames Dura, NP for the issue of active sleep apnea.  Was added to my schedule to evaluate for the issues of dyspnea.  He had previously seen Dr. Nicholos Johns.  I have reviewed his records.  He describes dyspnea mostly as fatigue.  He had pulmonary function test performed in 2021 that showed moderate restriction and fluttering of the flow-volume loop consistent with obstructive sleep apnea.  He had no bronchodilator response.  ERV was low at 33% indicating that restriction is likely secondary to obesity.  Diffusion capacity was not performed.  CT scan of the chest performed in August 2021 was essentially nonrevealing specifically no findings in the chest.  The patient has been set with auto CPAP but still continues to have issues with fatigue and somnolence during the day.  A CPAP titration study had been recommended.  He does note that he has orthopnea and paroxysmal nocturnal dyspnea.  He also endorses lower extremity edema.  On a prior echocardiogram of 28 November 2019, he was shown to have diastolic dysfunction grade II and mitral regurgitation.  There was also dilatation of the ascending aorta.  He has not had any cough, fevers, chills or sweats.  No wheezing.  No calf tenderness.  Has been checked for low T and levels are satisfactory.  The patient states that nothing makes his dyspnea or fatigue better.  He states that he has had the symptoms for "a long time".  He has worked in [employer] for a long time.  No exotic pets in the home.  No exotic hobbies.  Has lived in [place] all his life.   Review  of Systems A 10 point review of systems was performed and it is as noted above otherwise negative.  Patient Active Problem List   Diagnosis Date Noted  . OSA on CPAP 04/02/2017  . Leg pain 10/30/2014  . Gout of big toe 01/30/2014  . Weakness 01/30/2014  . Atrial fibrillation (HCC) 10/11/2013  . Rectus diastasis 08/28/2013  . Umbilical hernia 08/28/2013  . HYPERTRIGLYCERIDEMIA 09/25/2010  . DEPRESSION 09/25/2010  . ESSENTIAL HYPERTENSION, BENIGN 09/25/2010  . ALLERGIC RHINITIS CAUSE UNSPECIFIED 09/25/2010  . SPONDYLOSIS, CERVICAL 09/25/2010  . UNSPECIFIED TACHYCARDIA 09/25/2010  . GASTRITIS, CHRONIC 09/24/2010  . IMPOTENCE OF ORGANIC ORIGIN 09/24/2010  . LOW BACK PAIN, ACUTE 09/24/2010  . Dyspnea 09/24/2010  . Chest pain 09/24/2010   Social History   Tobacco Use  . Smoking status: Never Smoker  . Smokeless tobacco: Never Used  Substance Use Topics  . Alcohol use: Yes    Comment: very rare    Allergies  Allergen Reactions  . Codeine Other (See Comments)    Nervous   . Shrimp [Shellfish Allergy]   . Sulfa Antibiotics Rash   Current Meds  Medication Sig  . allopurinol (ZYLOPRIM) 100 MG tablet Take 100 mg by mouth daily.   Marland Kitchen allopurinol (ZYLOPRIM) 300 MG tablet Take 300 mg by mouth daily.   Marland Kitchen aspirin EC 81 MG tablet Take 81 mg by mouth daily.  . colchicine 0.6 MG tablet TAKE  1 TABLET (0.6 MG TOTAL) BY MOUTH 2 (TWO) TIMES DAILY AS NEEDED.  . flecainide (TAMBOCOR) 50 MG tablet TAKE 1 TABLET (50 MG TOTAL) BY MOUTH 2 (TWO) TIMES DAILY AS NEEDED.  . furosemide (LASIX) 20 MG tablet TAKE 1 TABLET (20 MG TOTAL) BY MOUTH AS NEEDED (FOR FULLNESS AND/OR LEG SWELLING).  Marland Kitchen lisinopril-hydrochlorothiazide (ZESTORETIC) 10-12.5 MG tablet TAKE 1 TABLET BY MOUTH EVERY DAY  . metoprolol succinate (TOPROL XL) 25 MG 24 hr tablet Take 1 tablet (25 mg total) by mouth daily.  . NON FORMULARY CPAP DAILY  . NON FORMULARY Oxygen 3 Liters bled into CPAP  Patient states that flecainide is a "pill in  the pocket" and only takes it if needed for PAF.  Immunization History  Administered Date(s) Administered  . Influenza,inj,Quad PF,6+ Mos 05/18/2018  . Influenza-Unspecified 05/22/2019, 04/08/2020   We discussed Covid-19 precautions.  I reviewed the vaccine effectiveness and potential side effects in detail to include differences between mRNA vaccines and traditional vaccines (attenuated virus).  Discussion also offered of long-term effectiveness and safety profile which are unclear at this time.  Discussed current CDC guidance that all patients are recommended COVID-19 vaccinations -as long as they do not have allergy to components of the vaccine.    Objective:   Physical Exam BP 128/74 (BP Location: Left Arm, Cuff Size: Normal)   Pulse 75   Temp (!) 97.3 F (36.3 C) (Temporal)   Ht 5\' 9"  (1.753 m)   Wt 243 lb 12.8 oz (110.6 kg)   SpO2 100%   BMI 36.00 kg/m  GENERAL: Obese gentleman, no acute distress, mild psychomotor retardation HEAD: Normocephalic, atraumatic.  EYES: Pupils equal, round, reactive to light.  No scleral icterus.  MOUTH: Nose/mouth/throat not examined due to masking requirements for COVID 19. NECK: Supple. No thyromegaly. Trachea midline. No JVD.  No adenopathy. PULMONARY: Good air entry bilaterally.  No adventitious sounds. CARDIOVASCULAR: S1 and S2. Regular rate and rhythm.  Grade 2/6 systolic ejection murmur left sternal border. ABDOMEN: Obese, otherwise benign. MUSCULOSKELETAL: No joint deformity, no clubbing, no edema.  NEUROLOGIC: No focal deficit, no gait disturbance, speech is fluent. SKIN: Intact,warm,dry.  On limited exam no rashes PSYCH: Flat affect, mild psychomotor retardation.       Assessment & Plan:     ICD-10-CM   1. OSA (obstructive sleep apnea)  G47.33 Cpap titration   Continues to have issues with daytime somnolence Nonrestorative sleep Will need CPAP titration study Query needs BiPAP  2. Shortness of breath  R06.02 ECHOCARDIOGRAM  COMPLETE   Cannot ascribe to pulmonary etiology Query cardiac etiology Known DD grade II and mitral regurg 2D echo  3. Grade II diastolic dysfunction  I51.89    Query worsening Reevaluate with 2D echo  4. Mitral valve insufficiency, unspecified etiology  I34.0    Query worsening Reevaluate with 2D echo  5. Paroxysmal atrial fibrillation (HCC)  I48.0    Management per Dr. This issue adds complexity to his management Flecainide used only as needed  6. Obesity (BMI 30-39.9)  E66.9    Recommend weight loss Restrictive physiology on PFTs due to obesity    Orders Placed This Encounter  Procedures  . ECHOCARDIOGRAM COMPLETE    Standing Status:   Future    Standing Expiration Date:   06/14/2021    Order Specific Question:   Where should this test be performed    Answer:   [facility]    Order Specific Question:   Perflutren DEFINITY (image enhancing agent) should  be administered unless hypersensitivity or allergy exist    Answer:   Administer Perflutren    Order Specific Question:   Reason for exam-Echo    Answer:   Dyspnea  R06.00  . Cpap titration    Titrate to bipap as needed.    Standing Status:   Future    Standing Expiration Date:   12/12/2021    Order Specific Question:   Where should this test be performed:    Answer:   [facility]   Patient presents for evaluation of dyspnea mostly noted as fatigue.  Has nonrestorative sleep despite CPAP and would benefit from CPAP titration study.  Suspect that he may need BiPAP.  Symptoms do not appear to be related to primary pulmonary issue.  Query cardiac component given orthopnea, paroxysmal nocturnal dyspnea and prior echo findings.  Patient may benefit from repeat thyroid functions and testosterone levels will defer to primary care provider.  We will see the patient in follow-up in 4 to 6 weeks time he is to contact us prior to that time should any new difficulties arise.   Gailen Shelter, MD [facility]   *This note  was dictated using voice recognition software/Dragon.  Despite best efforts to proofread, errors can occur which can change the meaning.  Any change was purely unintentional.

## 2020-12-20 ENCOUNTER — Encounter: Payer: Self-pay | Admitting: Pulmonary Disease

## 2020-12-31 ENCOUNTER — Ambulatory Visit: Payer: BC Managed Care – PPO | Attending: Neurology

## 2020-12-31 DIAGNOSIS — G4733 Obstructive sleep apnea (adult) (pediatric): Secondary | ICD-10-CM | POA: Diagnosis not present

## 2020-12-31 DIAGNOSIS — I4891 Unspecified atrial fibrillation: Secondary | ICD-10-CM | POA: Diagnosis not present

## 2021-01-01 ENCOUNTER — Other Ambulatory Visit: Payer: Self-pay

## 2021-01-02 ENCOUNTER — Telehealth (HOSPITAL_BASED_OUTPATIENT_CLINIC_OR_DEPARTMENT_OTHER): Payer: BC Managed Care – PPO | Admitting: Pulmonary Disease

## 2021-01-02 DIAGNOSIS — Z9989 Dependence on other enabling machines and devices: Secondary | ICD-10-CM

## 2021-01-02 DIAGNOSIS — G4733 Obstructive sleep apnea (adult) (pediatric): Secondary | ICD-10-CM | POA: Diagnosis not present

## 2021-01-02 NOTE — Telephone Encounter (Signed)
CPAP 11 cm required. He is already on autoCPAP , Consider stimulants if sleepiness persists despite good compliance & no other cause found

## 2021-01-06 NOTE — Telephone Encounter (Signed)
Patient on auto CPAP.

## 2021-01-17 ENCOUNTER — Other Ambulatory Visit: Payer: Self-pay

## 2021-01-17 ENCOUNTER — Ambulatory Visit (INDEPENDENT_AMBULATORY_CARE_PROVIDER_SITE_OTHER): Payer: BC Managed Care – PPO | Admitting: Urology

## 2021-01-17 ENCOUNTER — Encounter: Payer: Self-pay | Admitting: Urology

## 2021-01-17 VITALS — BP 124/77 | HR 93 | Ht 69.0 in | Wt 240.0 lb

## 2021-01-17 DIAGNOSIS — R3911 Hesitancy of micturition: Secondary | ICD-10-CM | POA: Diagnosis not present

## 2021-01-17 DIAGNOSIS — R3912 Poor urinary stream: Secondary | ICD-10-CM

## 2021-01-17 DIAGNOSIS — R399 Unspecified symptoms and signs involving the genitourinary system: Secondary | ICD-10-CM | POA: Diagnosis not present

## 2021-01-17 DIAGNOSIS — R3915 Urgency of urination: Secondary | ICD-10-CM | POA: Diagnosis not present

## 2021-01-17 LAB — URINALYSIS, COMPLETE
Bilirubin, UA: NEGATIVE
Glucose, UA: NEGATIVE
Ketones, UA: NEGATIVE
Leukocytes,UA: NEGATIVE
Nitrite, UA: NEGATIVE
Protein,UA: NEGATIVE
RBC, UA: NEGATIVE
Specific Gravity, UA: 1.02 (ref 1.005–1.030)
Urobilinogen, Ur: 0.2 mg/dL (ref 0.2–1.0)
pH, UA: 5.5 (ref 5.0–7.5)

## 2021-01-17 LAB — MICROSCOPIC EXAMINATION
Bacteria, UA: NONE SEEN
Epithelial Cells (non renal): NONE SEEN /hpf (ref 0–10)
RBC, Urine: NONE SEEN /hpf (ref 0–2)

## 2021-01-17 MED ORDER — TAMSULOSIN HCL 0.4 MG PO CAPS
0.4000 mg | ORAL_CAPSULE | Freq: Every day | ORAL | 1 refills | Status: DC
Start: 2021-01-17 — End: 2021-01-28

## 2021-01-17 NOTE — Progress Notes (Signed)
01/17/2021 2:13 PM   Randy Schneider 10-31-1965 [id number]  Referring provider: Kinnie Feil, MD [address] [place],  [place] [address]  Chief Complaint  Patient presents with   Benign Prostatic Hypertrophy    HPI: Randy Schneider is a 55 y.o. male referred for evaluation of lower urinary tract symptoms.  Followed for stage 3a kidney disease and on recent nephrology follow-up was complaining of bothersome LUTS 1 year history of progressive voiding symptoms including sensation of incomplete emptying, frequency, weak stream, straining to urinate IPSS 26/35 Denies dysuria, gross hematuria Does have chronic right back pain and intermittent right scrotal pain Recent renal ultrasound unremarkable No prior history of urologic problems or prior urologic evaluation     PMH: Past Medical History:  Diagnosis Date   A-fib (HCC)    Acute gastritis with hemorrhage    Allergy    Cardiomegaly    Cervical spondylosis without myelopathy    CHF (congestive heart failure) (HCC)    Chronic kidney disease    stage 3    Depression    Gout    Hiatal hernia    Hypertension    Impotence of organic origin    Leaky heart valve    Lumbago    Migraine without aura, without mention of intractable migraine without mention of status migrainosus    Oxygen deficiency    on 3 Liters 02 with CPAP only    Pure hyperglyceridemia    Renal insufficiency    Sleep apnea    wears cpap with 3 L 02 at night     Surgical History: Past Surgical History:  Procedure Laterality Date   BACK SURGERY  1999   CARPAL TUNNEL RELEASE Right 1994   COLONOSCOPY     age 23 for bleeding - normal    NASAL SINUS SURGERY  2012   ROTATOR CUFF REPAIR Left 2003   UPPER GASTROINTESTINAL ENDOSCOPY     age 89- for bleeding     Home Medications:  Allergies as of 01/17/2021       Reactions   Codeine Other (See Comments)   Nervous    Shrimp [shellfish Allergy]    Sulfa Antibiotics Rash        Medication  List        Accurate as of January 17, 2021  2:13 PM. If you have any questions, ask your nurse or doctor.          allopurinol 100 MG tablet Commonly known as: ZYLOPRIM Take 100 mg by mouth daily.   allopurinol 300 MG tablet Commonly known as: ZYLOPRIM Take 300 mg by mouth daily.   aspirin EC 81 MG tablet Take 81 mg by mouth daily.   colchicine 0.6 MG tablet TAKE 1 TABLET (0.6 MG TOTAL) BY MOUTH 2 (TWO) TIMES DAILY AS NEEDED.   flecainide 50 MG tablet Commonly known as: TAMBOCOR TAKE 1 TABLET (50 MG TOTAL) BY MOUTH 2 (TWO) TIMES DAILY AS NEEDED.   furosemide 20 MG tablet Commonly known as: LASIX TAKE 1 TABLET (20 MG TOTAL) BY MOUTH AS NEEDED (FOR FULLNESS AND/OR LEG SWELLING).   lisinopril-hydrochlorothiazide 10-12.5 MG tablet Commonly known as: ZESTORETIC TAKE 1 TABLET BY MOUTH EVERY DAY   metoprolol succinate 25 MG 24 hr tablet Commonly known as: Toprol XL Take 1 tablet (25 mg total) by mouth daily.   NON FORMULARY Oxygen 3 Liters bled into CPAP   NON FORMULARY CPAP DAILY   tamsulosin 0.4 MG Caps capsule Commonly known as:  FLOMAX Take 1 capsule (0.4 mg total) by mouth daily. Started by: Riki Altes, MD        Allergies:  Allergies  Allergen Reactions   Codeine Other (See Comments)    Nervous    Shrimp [Shellfish Allergy]    Sulfa Antibiotics Rash    Family History: Family History  Problem Relation Age of Onset   Heart attack Mother    Leukemia Mother    Heart attack Father    Heart failure Father    Arrhythmia Sister        A-Fib   Heart disease Brother    Arrhythmia Brother        A-Fib   Colon polyps Neg Hx    Colon cancer Neg Hx    Esophageal cancer Neg Hx    Stomach cancer Neg Hx    Rectal cancer Neg Hx     Social History:  reports that he has never smoked. He has never used smokeless tobacco. He reports current alcohol use. He reports previous drug use. Drug: Marijuana.   Physical Exam: BP 124/77   Pulse 93   Ht 5\' 9"   (1.753 m)   Wt 240 lb (108.9 kg)   BMI 35.44 kg/m   Constitutional:  Alert and oriented, No acute distress. HEENT: [place] AT, moist mucus membranes.  Trachea midline, no masses. Cardiovascular: No clubbing, cyanosis, or edema. Respiratory: Normal respiratory effort, no increased work of breathing. GU: Phallus without lesions, testes descended bilateral without masses or tenderness.  Spermatic cord/epididymis palpably normal bilaterally prostate 30 g, smooth without nodules Skin: No rashes, bruises or suspicious lesions. Neurologic: Grossly intact, no focal deficits, moving all 4 extremities. Psychiatric: Normal mood and affect.   Assessment & Plan:    1.  Lower urinary tract symptoms We discussed potential etiologies including bladder outlet obstruction secondary to BPH or increased bladder neck tone and less common urethral stricture or detrusor overactivity Bladder scan PVR 13 mL Trial tamsulosin 0.4 mg daily PA follow-up 1 month for symptom reassessment    , MD  [facility] [address], [address] [place], [place] [phone]

## 2021-01-20 ENCOUNTER — Encounter: Payer: Self-pay | Admitting: Urology

## 2021-01-22 ENCOUNTER — Telehealth: Payer: Self-pay | Admitting: Primary Care

## 2021-01-22 NOTE — Telephone Encounter (Signed)
He has sleep apnea. Recommend CPAP at 11cm h20 with small airfit f30i full face mask. Aim to wear CPAP every night for min 4-6 hours or longer. Will need fu 31-90 days after receiving machine

## 2021-01-22 NOTE — Telephone Encounter (Signed)
Received sleep study report from [facility].  Results are available via epic.  Beth, please advise. Thanks

## 2021-01-23 NOTE — Telephone Encounter (Signed)
Patient's spouse, Nancy(DPR) is aware of results and voiced her understanding.  Randy Schneider stated that she will discuss results with patient and call back with update.

## 2021-01-24 ENCOUNTER — Ambulatory Visit (INDEPENDENT_AMBULATORY_CARE_PROVIDER_SITE_OTHER): Payer: BC Managed Care – PPO

## 2021-01-24 ENCOUNTER — Other Ambulatory Visit: Payer: Self-pay

## 2021-01-24 DIAGNOSIS — R0602 Shortness of breath: Secondary | ICD-10-CM | POA: Diagnosis not present

## 2021-01-24 LAB — ECHOCARDIOGRAM COMPLETE
AR max vel: 2.97 cm2
AV Area VTI: 3.29 cm2
AV Area mean vel: 3.19 cm2
AV Mean grad: 4 mmHg
AV Peak grad: 7.6 mmHg
Ao pk vel: 1.38 m/s
Area-P 1/2: 3.97 cm2
Calc EF: 51.8 %
S' Lateral: 3.4 cm
Single Plane A2C EF: 52.4 %
Single Plane A4C EF: 52.5 %

## 2021-01-28 ENCOUNTER — Telehealth: Payer: Self-pay

## 2021-01-28 MED ORDER — SILODOSIN 8 MG PO CAPS
8.0000 mg | ORAL_CAPSULE | Freq: Every day | ORAL | 0 refills | Status: DC
Start: 2021-01-28 — End: 2021-02-24

## 2021-01-28 NOTE — Telephone Encounter (Signed)
Recommend trial silodosin x30 days.  Rx sent to pharmacy.  This is more specific for the prostate and has less side effects though a higher side effect of decreased semen on ejaculation

## 2021-01-28 NOTE — Telephone Encounter (Signed)
Patient's wife called stating patient is not able to continue taking Tamsulosin. He starting having terrible stuffy nose and congestion after starting the medication and this interferes with his C-PAP. After stopping the medication his symptoms got better. Is there another medication he can try for his urinary symptoms? Please advise

## 2021-01-28 NOTE — Telephone Encounter (Signed)
Notified patient as instructed, patient pleased. Discussed follow-up appointments, patient agrees  

## 2021-02-17 ENCOUNTER — Ambulatory Visit: Payer: Self-pay | Admitting: Physician Assistant

## 2021-02-22 ENCOUNTER — Other Ambulatory Visit: Payer: Self-pay | Admitting: Urology

## 2021-02-26 ENCOUNTER — Other Ambulatory Visit: Payer: Self-pay | Admitting: Urology

## 2021-10-31 ENCOUNTER — Other Ambulatory Visit: Payer: Self-pay | Admitting: Cardiovascular Disease

## 2021-10-31 NOTE — Telephone Encounter (Signed)
Last seen 2021. Needs appt for refills. Thank you! ?

## 2021-11-04 NOTE — Telephone Encounter (Signed)
Last seen 2021. Needs appt for refills. Thank you! ?

## 2021-11-05 NOTE — Telephone Encounter (Signed)
Attempted to schedule  Not available will call back  ?

## 2021-11-14 NOTE — Telephone Encounter (Signed)
Please advise if OK to refills. Last seen 2021. Not sure where he has been getting refills. Scheduled office visit with Dr. Mariah Milling on 12/19/21. Thank you! ?

## 2021-11-14 NOTE — Telephone Encounter (Signed)
Scheduled

## 2021-12-09 ENCOUNTER — Other Ambulatory Visit: Payer: Self-pay | Admitting: Cardiovascular Disease

## 2021-12-09 NOTE — Telephone Encounter (Signed)
Please advise if ok to refill. ?Pt hasn't been seen since 03/2020. ?Pt has future appointment scheduled on 12/19/2021. ?

## 2021-12-16 NOTE — Progress Notes (Signed)
Date:  12/19/2021   ID:  [name], DOB [birth year], MRN [id number]  Patient Location:  [address] [place] [address]   Provider location:   [facility] [place] office  PCP:  Ronal Fear, NP  Cardiologist:  Fonnie Mu   Chief Complaint  Patient presents with   12 month follow up     Patient c/o 3 weeks ago having a spell with head pressure, decrease in blood pressure, chest pressure/indigestion and left arm numbness. Medications reviewed by the patient verbally.       History of Present Illness:    MERION GRIMALDO is a 56 y.o. male past medical history of paroxysmal atrial fibrillation,  chronic chest pain,  chronic neck pain ,  depression, Chronic fatigue,  obstructive sleep apnea on CPAP,  migraines,  shortness of breath starting back > 5 years ago  cardiac catheterization in March 2013 showing no significant coronary artery disease, seen at [facility] medical clinic. who presents for routine of his shortness of breath, atrial fibrillation.   Last seen clinic by myself August 2021 Prior imaging reviewed CT chest August 2021 no acute findings apart from aortic atherosclerosis  In follow-up today was concerned about low blood pressure, stopped his lisinopril HCTZ and metoprolol Reports having tachypalpitations periodically concerning for atrial fibrillation, takes flecainide as needed  Atypical pain left neck several weeks ago, next day some discomfort into the chest, no further symptoms since  BP at home: 116/54 Elevated today:  Followed by nephrology for chronic renal failure stage III  EKG personally reviewed by myself on todays visit Normal sinus rhythm rate 73 bpm no significant ST-T wave changes  Other past medical history reviewed Myoview April 2021, low risk study Echocardiogram performed ejection fraction 60 to 65%  ZIO monitor for near syncope, no significant arrhythmia noted  Previously seen by  pulmonary Chronic shortness of breath dating back 10 years, No regular exercise program, previously declined pulmonary rehab Reports inhalers did not help his breathing  OSA on CPAP  Had CT scan: for SOB Images pulled up and reviewed, minimal aortic atherosclerosis, no coronary calcification noted  Reported 1 remote  episode of atrial fibrillation  Sleeping the couch , wife startled him, went into atrial  Symptoms lasted 30 minutes, took flecainide 2.    Past Medical History:  Diagnosis Date   A-fib (HCC)    Acute gastritis with hemorrhage    Allergy    Cardiomegaly    Cervical spondylosis without myelopathy    CHF (congestive heart failure) (HCC)    Chronic kidney disease    stage 3    Depression    Gout    Hiatal hernia    Hypertension    Impotence of organic origin    Leaky heart valve    Lumbago    Migraine without aura, without mention of intractable migraine without mention of status migrainosus    Oxygen deficiency    on 3 Liters 02 with CPAP only    Pure hyperglyceridemia    Renal insufficiency    Sleep apnea    wears cpap with 3 L 02 at night    Past Surgical History:  Procedure Laterality Date   BACK SURGERY  1999   CARPAL TUNNEL RELEASE Right 1994   COLONOSCOPY     age 89 for bleeding - normal    NASAL SINUS SURGERY  2012   ROTATOR CUFF REPAIR Left 2003   UPPER  GASTROINTESTINAL ENDOSCOPY     age 56- for bleeding      Current Meds  Medication Sig   allopurinol (ZYLOPRIM) 100 MG tablet Take 100 mg by mouth daily.    allopurinol (ZYLOPRIM) 300 MG tablet Take 300 mg by mouth daily.    aspirin EC 81 MG tablet Take 81 mg by mouth daily.   cetirizine (ZYRTEC) 10 MG tablet Take 10 mg by mouth daily.   clonazePAM (KLONOPIN) 0.5 MG tablet Take 0.5 mg by mouth 2 (two) times daily as needed.   colchicine 0.6 MG tablet TAKE 1 TABLET (0.6 MG TOTAL) BY MOUTH 2 (TWO) TIMES DAILY AS NEEDED.   flecainide (TAMBOCOR) 50 MG tablet TAKE 1 TABLET BY MOUTH 2 TIMES  DAILY AS NEEDED.   furosemide (LASIX) 20 MG tablet TAKE 1 TABLET (20 MG TOTAL) BY MOUTH AS NEEDED (FOR FULLNESS AND/OR LEG SWELLING).   montelukast (SINGULAIR) 10 MG tablet Take 10 mg by mouth daily.   nitroGLYCERIN (NITROSTAT) 0.4 MG SL tablet Place under the tongue.   NON FORMULARY CPAP DAILY   NON FORMULARY Oxygen 3 Liters bled into CPAP   silodosin (RAPAFLO) 8 MG CAPS capsule TAKE 1 CAPSULE BY MOUTH DAILY WITH BREAKFAST.     Allergies:   Codeine, Shrimp [shellfish allergy], and Sulfa antibiotics   Social History   Tobacco Use   Smoking status: Never   Smokeless tobacco: Never  Vaping Use   Vaping Use: Never used  Substance Use Topics   Alcohol use: Yes    Comment: very rare    Drug use: Not Currently    Types: Marijuana    Comment: 30 yrs     Family Hx: The patient's family history includes Arrhythmia in his brother and sister; Heart attack in his father and mother; Heart disease in his brother; Heart failure in his father; Leukemia in his mother. There is no history of Colon polyps, Colon cancer, Esophageal cancer, Stomach cancer, or Rectal cancer.  ROS:   Please see the history of present illness.    Review of Systems  Constitutional: Negative.   HENT: Negative.    Respiratory:  Positive for shortness of breath.   Cardiovascular: Negative.   Gastrointestinal: Negative.   Musculoskeletal: Negative.   Neurological: Negative.   Psychiatric/Behavioral: Negative.    All other systems reviewed and are negative.   Labs/Other Tests and Data Reviewed:    Recent Labs: No results found for requested labs within last 8760 hours.   Recent Lipid Panel No results found for: CHOL, TRIG, HDL, CHOLHDL, LDLCALC, LDLDIRECT  Wt Readings from Last 3 Encounters:  12/19/21 236 lb 2 oz (107.1 kg)  01/17/21 240 lb (108.9 kg)  12/12/20 243 lb 12.8 oz (110.6 kg)     Exam:    Vital Signs: Vital signs may also be detailed in the HPI BP (!) 142/80 (BP Location: Left Arm, Patient  Position: Sitting, Cuff Size: Normal)   Pulse 73   Ht 5\' 9"  (1.753 m)   Wt 236 lb 2 oz (107.1 kg)   SpO2 98%   BMI 34.87 kg/m   Constitutional:  oriented to person, place, and time. No distress.  HENT:  Head: Grossly normal Eyes:  no discharge. No scleral icterus.  Neck: No JVD, no carotid bruits  Cardiovascular: Regular rate and rhythm, no murmurs appreciated Pulmonary/Chest: Clear to auscultation bilaterally, no wheezes or rails Abdominal: Soft.  no distension.  no tenderness.  Musculoskeletal: Normal range of motion Neurological:  normal muscle tone. Coordination normal.  No atrophy Skin: Skin warm and dry Psychiatric: normal affect, pleasant   ASSESSMENT & PLAN:    Paroxysmal atrial fibrillation (HCC) -  CHADS VASC is low, not on anticoagulation Took himself Off metoprolol, concerned it was hurting his kidneys Continues to take likely night as needed for paroxysmal A-fib symptoms Recommend he reconsider restarting his metoprolol  Essential hypertension, benign - Reports he has had low blood pressures and he stopped lisinopril HCTZ and metoprolol Recommend he try to continue lisinopril alone new prescription for lisinopril 1017 He does take Lasix as needed for swelling  Shortness of breath -  Significant prior testing Recommend weight loss, regular walking program for conditioning Inhalers do not seem to help Previously declined pulmonary rehab  Chest pain, unspecified type -  Atypical in nature, prior cardiac work-up he has had cardiac catheterization  OSA on CPAP On CPAP Followed by pulmonary  CRI Stable renal function August 2020 Followed by nephrology   Total encounter time more than 30 minutes  Greater than 50% was spent in counseling and coordination of care with the patient    Signed, Julien Nordmann, MD  12/19/2021 11:29 AM    [facility] [address], [place], [place] [address]

## 2021-12-19 ENCOUNTER — Encounter: Payer: Self-pay | Admitting: Cardiovascular Disease

## 2021-12-19 ENCOUNTER — Ambulatory Visit: Payer: 59 | Admitting: Cardiovascular Disease

## 2021-12-19 VITALS — BP 142/80 | HR 73 | Ht 69.0 in | Wt 236.1 lb

## 2021-12-19 DIAGNOSIS — I700 Atherosclerosis of aorta: Secondary | ICD-10-CM

## 2021-12-19 DIAGNOSIS — I10 Essential (primary) hypertension: Secondary | ICD-10-CM | POA: Diagnosis not present

## 2021-12-19 DIAGNOSIS — I480 Paroxysmal atrial fibrillation: Secondary | ICD-10-CM | POA: Diagnosis not present

## 2021-12-19 DIAGNOSIS — E781 Pure hyperglyceridemia: Secondary | ICD-10-CM

## 2021-12-19 MED ORDER — FLECAINIDE ACETATE 50 MG PO TABS: 50.0000 mg | ORAL_TABLET | Freq: Two times a day (BID) | ORAL | 1 refills | Status: DC | PRN

## 2021-12-19 MED ORDER — LISINOPRIL 10 MG PO TABS: 10.0000 mg | ORAL_TABLET | Freq: Every day | ORAL | 3 refills | Status: DC

## 2021-12-19 MED ORDER — SILDENAFIL CITRATE 20 MG PO TABS: 20.0000 mg | ORAL_TABLET | Freq: Three times a day (TID) | ORAL | 0 refills | Status: AC

## 2021-12-19 MED ORDER — METOPROLOL SUCCINATE ER 25 MG PO TB24: 25.0000 mg | ORAL_TABLET | Freq: Every day | ORAL | 3 refills | Status: DC

## 2021-12-19 NOTE — Patient Instructions (Addendum)
Medication Instructions:   STOP lisinopril HCTZ   START lisinopril 10 mg daily  Lasix sparingly  Metoprolol for atrial fibrillation  Sildenafil take 60-100 mg as needed  If you need a refill on your cardiac medications before your next appointment, please call your pharmacy.   Lab work: No new labs needed  Testing/Procedures: No new testing needed  Follow-Up: At [facility], you and your health needs are our priority.  As part of our continuing mission to provide you with exceptional heart care, we have created designated Provider Care Teams.  These Care Teams include your primary Cardiologist (physician) and Advanced Practice Providers (APPs -  Physician Assistants and Nurse Practitioners) who all work together to provide you with the care you need, when you need it.  You will need a follow up appointment in 12 months  Providers on your designated Care Team:   Nicolasa Ducking, NP Eula Listen, PA-C Cadence Fransico Michael, [place]  COVID-19 Vaccine Information can be found at: [email or web address] For questions related to vaccine distribution or appointments, please email [email or web address] or call [phone].

## 2022-09-24 ENCOUNTER — Telehealth: Payer: Self-pay | Admitting: Cardiovascular Disease

## 2022-09-24 NOTE — Telephone Encounter (Signed)
Pt's wife called reporting pt has been experiencing off and on left sided chest and arm pain. She stated pt is unable to describe symptoms but stated its noticeable pain. She unable to associate any precipitating or alleviating factor and report pain can happen at rest and with exertion.  She also reported pt is not taking his metoprolol.  Appointment scheduled for 2/27 for further evaluations. Wife made aware to have pt report to ER if symptoms reoccur. Wife verbalized understanding.

## 2022-09-24 NOTE — Telephone Encounter (Signed)
Pt c/o of Chest Pain: STAT if CP now or developed within 24 hours  1. Are you having CP right now? Not currently with him  2. Are you experiencing any other symptoms (ex. SOB, nausea, vomiting, sweating)? Headache, thinks he may have increased SOB  3. How long have you been experiencing CP? Maybe a couple weeks or more  4. Is your CP continuous or coming and going? Comes and goes  5. Have you taken Nitroglycerin? Does not think so  Patient's wife states the patient told her he "does not feel right" in the area of the left side of his heart. She says he also has a feeling in his left arm that is not constant. She says he has not given her more information. She says in the last year he has had covid twice and pneumonia. She says he is still working every day and requests the call back go to her, because he is at work now.

## 2022-09-28 NOTE — Progress Notes (Unsigned)
Date:  09/29/2022   ID:  Randy Schneider, DOB [birth year], MRN [id number]  Patient Location:  [place] [address]   Provider location:   [facility], [place] office  PCP:  Cyndi Bender, PA-C  Cardiologist:  Arvid Right [facility]   Chief Complaint  Patient presents with   Chest Pain    Patient c/o shortness of breath, irregular heartbeats, chest pain, left arm numbness that radiates up into his chest. Medications reviewed by the patient verbally.      History of Present Illness:    Randy Schneider is a 57 y.o. male past medical history of paroxysmal atrial fibrillation,  chronic chest pain,  chronic neck pain ,  depression, Chronic fatigue,  obstructive sleep apnea on CPAP,  migraines,  shortness of breath starting back > 5 years ago  cardiac catheterization in March 2013 showing no significant coronary artery disease, seen at [facility] medical clinic. who presents for routine of his shortness of breath, atrial fibrillation.   Last seen clinic by myself August 2021 Prior imaging reviewed CT chest August 2021 no acute findings apart from aortic atherosclerosis  Reports having chronic SOB on exertion, lightheadedness States this is why he had to stop working approximately 10 years ago Symptoms have persisted  Cardiac workup unrevealing including heart catheterizations, echocardiograms, CT scans  Reports having episodes of hypoxia at home daytime, wears oxygen at night with his CPAP Followed by pulmonary  Taking flecainide 3-4 x a week for paroxysmal tachycardia Stopped metoprolol secondary to migraines, urine retention  Not on statin,myalgias  CT scan images pulled up and reviewed, minimal aortic atherosclerosis, no significant coronary calcification  EKG personally reviewed by myself on todays visit Normal sinus rhythm rate 81 bpm no significant ST-T wave changes  Other past medical history reviewed Myoview April 2021, low  risk study Echocardiogram performed ejection fraction 60 to 65%  ZIO monitor for near syncope, no significant arrhythmia noted  Previously seen by pulmonary Chronic shortness of breath dating back 10 years, No regular exercise program, previously declined pulmonary rehab Reports inhalers did not help his breathing  OSA on CPAP  Had CT scan: for SOB Images pulled up and reviewed, minimal aortic atherosclerosis, no coronary calcification noted  Reported 1 remote  episode of atrial fibrillation  Sleeping the couch , wife startled him, went into atrial  Symptoms lasted 30 minutes, took flecainide 2.    Past Medical History:  Diagnosis Date   A-fib ([place])    Acute gastritis with hemorrhage    Allergy    Cardiomegaly    Cervical spondylosis without myelopathy    CHF (congestive heart failure) (HCC)    Chronic kidney disease    stage 3    Depression    Gout    Hiatal hernia    Hypertension    Impotence of organic origin    Leaky heart valve    Lumbago    Migraine without aura, without mention of intractable migraine without mention of status migrainosus    Oxygen deficiency    on 3 Liters 02 with CPAP only    Pure hyperglyceridemia    Renal insufficiency    Sleep apnea    wears cpap with 3 L 02 at night    Past Surgical History:  Procedure Laterality Date   BACK SURGERY  1999   CARPAL TUNNEL RELEASE Right 1994   COLONOSCOPY     age 6 for bleeding - normal  NASAL SINUS SURGERY  2012   ROTATOR CUFF REPAIR Left 2003   UPPER GASTROINTESTINAL ENDOSCOPY     age 67- for bleeding      Current Meds  Medication Sig   allopurinol (ZYLOPRIM) 100 MG tablet Take 100 mg by mouth daily.    allopurinol (ZYLOPRIM) 300 MG tablet Take 300 mg by mouth daily.    aspirin EC 81 MG tablet Take 81 mg by mouth daily.   clonazePAM (KLONOPIN) 0.5 MG tablet Take 0.5 mg by mouth 2 (two) times daily as needed.   colchicine 0.6 MG tablet TAKE 1 TABLET (0.6 MG TOTAL) BY MOUTH 2 (TWO) TIMES  DAILY AS NEEDED.   flecainide (TAMBOCOR) 50 MG tablet Take 1 tablet (50 mg total) by mouth 2 (two) times daily as needed.   furosemide (LASIX) 20 MG tablet TAKE 1 TABLET (20 MG TOTAL) BY MOUTH AS NEEDED (FOR FULLNESS AND/OR LEG SWELLING).   ipratropium (ATROVENT) 0.06 % nasal spray Place 1 spray into both nostrils 3 (three) times daily.   lisinopril-hydrochlorothiazide (ZESTORETIC) 10-12.5 MG tablet Take 1 tablet by mouth daily.   nitroGLYCERIN (NITROSTAT) 0.4 MG SL tablet Place under the tongue.   NON FORMULARY CPAP DAILY   NON FORMULARY Oxygen 3 Liters bled into CPAP   sildenafil (REVATIO) 20 MG tablet Take 1 tablet (20 mg total) by mouth 3 (three) times daily.     Allergies:   Shellfish allergy, Codeine, and Sulfa antibiotics   Social History   Tobacco Use   Smoking status: Never   Smokeless tobacco: Never  Vaping Use   Vaping Use: Never used  Substance Use Topics   Alcohol use: Yes    Comment: very rare    Drug use: Not Currently    Types: Marijuana    Comment: 30 yrs     Family Hx: The patient's family history includes Arrhythmia in his brother and sister; Heart attack in his father and mother; Heart disease in his brother; Heart failure in his father; Leukemia in his mother. There is no history of Colon polyps, Colon cancer, Esophageal cancer, Stomach cancer, or Rectal cancer.  ROS:   Please see the history of present illness.    Review of Systems  Constitutional: Negative.   HENT: Negative.    Respiratory:  Positive for shortness of breath.   Cardiovascular: Negative.   Gastrointestinal: Negative.   Musculoskeletal: Negative.   Neurological: Negative.   Psychiatric/Behavioral: Negative.    All other systems reviewed and are negative.   Labs/Other Tests and Data Reviewed:    Recent Labs: No results found for requested labs within last 365 days.   Recent Lipid Panel No results found for: "CHOL", "TRIG", "HDL", "CHOLHDL", "LDLCALC", "LDLDIRECT"  Wt Readings  from Last 3 Encounters:  09/29/22 238 lb (108 kg)  12/19/21 236 lb 2 oz (107.1 kg)  01/17/21 240 lb (108.9 kg)     Exam:    Vital Signs: Vital signs may also be detailed in the HPI BP (!) 146/90 (BP Location: Left Arm, Patient Position: Sitting, Cuff Size: Normal)   Pulse 81   Ht '5\' 9"'$  (1.753 m)   Wt 238 lb (108 kg)   SpO2 93%   BMI 35.15 kg/m   Constitutional:  oriented to person, place, and time. No distress.  HENT:  Head: Grossly normal Eyes:  no discharge. No scleral icterus.  Neck: No JVD, no carotid bruits  Cardiovascular: Regular rate and rhythm, no murmurs appreciated Pulmonary/Chest: Clear to auscultation bilaterally, no wheezes or  rails Abdominal: Soft.  no distension.  no tenderness.  Musculoskeletal: Normal range of motion Neurological:  normal muscle tone. Coordination normal. No atrophy Skin: Skin warm and dry Psychiatric: normal affect, pleasant  ASSESSMENT & PLAN:    Paroxysmal atrial fibrillation (HCC) -  CHADS VASC is low, not on anticoagulation Took himself off metoprolol, concerned of side effects including constipation, urine retention Reports he would like to continue taking flecainide as needed for paroxysmal tachycardia  Essential hypertension, benign - Blood pressure elevated on today's visit.  No changes made to the medications. Given history of lightheadedness, would avoid overmedicating  Shortness of breath -  Significant prior testing Cardiac workup unrevealing Inhalers do not seem to help Previously declined pulmonary rehab.  Discussed lung works program again today He has been evaluated by pulmonary  Chest pain, unspecified type -  Atypical in nature, prior cardiac work-up he has had cardiac catheterization CT scan with no significant coronary disease  OSA on CPAP On CPAP with oxygen Followed by pulmonary  CRI Stable renal function August 2020 Followed by nephrology   Total encounter time more than 30 minutes  Greater than 50%  was spent in counseling and coordination of care with the patient    Signed, Ida Rogue, MD  09/29/2022 3:38 PM    [place] [facility] [address], [place], [place] [address]

## 2022-09-29 ENCOUNTER — Ambulatory Visit
Payer: No Typology Code available for payment source | Attending: Cardiovascular Disease | Admitting: Cardiovascular Disease

## 2022-09-29 ENCOUNTER — Encounter: Payer: Self-pay | Admitting: Cardiovascular Disease

## 2022-09-29 VITALS — BP 146/90 | HR 81 | Ht 69.0 in | Wt 238.0 lb

## 2022-09-29 DIAGNOSIS — I10 Essential (primary) hypertension: Secondary | ICD-10-CM

## 2022-09-29 DIAGNOSIS — I480 Paroxysmal atrial fibrillation: Secondary | ICD-10-CM | POA: Diagnosis not present

## 2022-09-29 DIAGNOSIS — I700 Atherosclerosis of aorta: Secondary | ICD-10-CM

## 2022-09-29 DIAGNOSIS — G4733 Obstructive sleep apnea (adult) (pediatric): Secondary | ICD-10-CM

## 2022-09-29 DIAGNOSIS — R0609 Other forms of dyspnea: Secondary | ICD-10-CM

## 2022-09-29 MED ORDER — FUROSEMIDE 20 MG PO TABS: 20.0000 mg | ORAL_TABLET | ORAL | 1 refills | Status: DC | PRN

## 2022-09-29 MED ORDER — FLECAINIDE ACETATE 50 MG PO TABS: 50.0000 mg | ORAL_TABLET | Freq: Two times a day (BID) | ORAL | 1 refills | Status: DC | PRN

## 2022-09-29 MED ORDER — LISINOPRIL-HYDROCHLOROTHIAZIDE 10-12.5 MG PO TABS: 1.0000 | ORAL_TABLET | Freq: Every day | ORAL | 2 refills | Status: DC

## 2022-09-29 NOTE — Patient Instructions (Signed)
Medication Instructions:  No changes  OTC omeprazole for acid reflux   If you need a refill on your cardiac medications before your next appointment, please call your pharmacy.   Lab work: No new labs needed  Testing/Procedures: No new testing needed  Follow-Up: At [facility], you and your health needs are our priority.  As part of our continuing mission to provide you with exceptional heart care, we have created designated Provider Care Teams.  These Care Teams include your primary Cardiologist (physician) and Advanced Practice Providers (APPs -  Physician Assistants and Nurse Practitioners) who all work together to provide you with the care you need, when you need it.  You will need a follow up appointment in 12 months  Providers on your designated Care Team:   Murray Hodgkins, NP Christell Faith, PA-C Cadence Kathlen Mody, [place]  COVID-19 Vaccine Information can be found at: [email or web address] For questions related to vaccine distribution or appointments, please email [email or web address] or call [phone].

## 2022-10-13 ENCOUNTER — Other Ambulatory Visit: Payer: Self-pay | Admitting: Cardiovascular Disease

## 2023-05-31 ENCOUNTER — Telehealth: Payer: Self-pay | Admitting: Pulmonary Disease

## 2023-05-31 NOTE — Telephone Encounter (Signed)
Patient wife calling in requesting a appointment with Dr. Jayme Cloud due to her husband CPAP Machine "Going-off" Informed pt Dr. Jayme Cloud has nothing until 08/09/2023. Pt wife got frustrated b/c she feels this is a emergency. Informed pt wife he was supposed to do a follow up 4-6 weeks after LOV on 12/2020.

## 2023-05-31 NOTE — Telephone Encounter (Signed)
I spoke with the patient's wife. She said his pressure setting have been going up. She thinks the settings need to be adjusted. I scheduled him an appt to see Buelah Manis, NP on 11/1 at 10:30am.  Nothing further needed.

## 2023-06-04 ENCOUNTER — Encounter: Payer: Self-pay | Admitting: Primary Care

## 2023-06-04 ENCOUNTER — Ambulatory Visit (INDEPENDENT_AMBULATORY_CARE_PROVIDER_SITE_OTHER): Payer: 59 | Admitting: Primary Care

## 2023-06-04 VITALS — BP 138/90 | HR 73 | Temp 98.4°F | Ht 69.0 in | Wt 235.2 lb

## 2023-06-04 DIAGNOSIS — R0602 Shortness of breath: Secondary | ICD-10-CM

## 2023-06-04 DIAGNOSIS — I5032 Chronic diastolic (congestive) heart failure: Secondary | ICD-10-CM

## 2023-06-04 DIAGNOSIS — G4733 Obstructive sleep apnea (adult) (pediatric): Secondary | ICD-10-CM

## 2023-06-04 MED ORDER — ALBUTEROL SULFATE HFA 108 (90 BASE) MCG/ACT IN AERS: INHALATION_SPRAY | RESPIRATORY_TRACT | 0 refills | Status: DC

## 2023-06-04 MED ORDER — MONTELUKAST SODIUM 10 MG PO TABS: 10.0000 mg | ORAL_TABLET | Freq: Every day | ORAL | 5 refills | Status: AC

## 2023-06-04 NOTE — Progress Notes (Signed)
@Patient  ID: [name], male    DOB: 09-27-1965, 58 y.o.   MRN: [id number]  Chief Complaint  Patient presents with   Follow-up    Wakes up gasping for air. O2 has been dropping. Wakes up with pain in the chest and cough.     Referring provider: Lonie Peak, PA-C   Discussed the use of AI scribe software for clinical note transcription with the patient, who gave verbal consent to proceed.  HPI: 57 year old male, never smoked.  Past medical history significant for OSA, hypertension, A-fib, diastolic heart failure.  Previous [facility] pulmonary encounter: 12/12/20- Dr. Jayme Cloud  The patient is a 57 year old lifelong never smoker who presents for the issue of dyspnea.  He recently saw our nurse practitioner Ames Dura, NP for the issue of active sleep apnea.  Was added to my schedule to evaluate for the issues of dyspnea.  He had previously seen Dr. Nicholos Johns.  I have reviewed his records.  He describes dyspnea mostly as fatigue.  He had pulmonary function test performed in 2021 that showed moderate restriction and fluttering of the flow-volume loop consistent with obstructive sleep apnea.  He had no bronchodilator response.  ERV was low at 33% indicating that restriction is likely secondary to obesity.  Diffusion capacity was not performed.  CT scan of the chest performed in August 2021 was essentially nonrevealing specifically no findings in the chest.  The patient has been set with auto CPAP but still continues to have issues with fatigue and somnolence during the day.  A CPAP titration study had been recommended.  He does note that he has orthopnea and paroxysmal nocturnal dyspnea.  He also endorses lower extremity edema.  On a prior echocardiogram of 28 November 2019, he was shown to have diastolic dysfunction grade II and mitral regurgitation.  There was also dilatation of the ascending aorta.  He has not had any cough, fevers, chills or sweats.  No wheezing.  No calf  tenderness.  Has been checked for low T and levels are satisfactory.  The patient states that nothing makes his dyspnea or fatigue better.  He states that he has had the symptoms for "a long time".  He has worked in [employer] for a long time.  No exotic pets in the home.  No exotic hobbies.  Has lived in [place] all his life.     06/04/2023- Interim hx  Patient presents today for routine follow-up OSA/dyspnea. He was last seen in May 2022. He reported undergoing a sleep study as previously recommended, which resulted in the prescription of a CPAP machine. Despite consistent use of the CPAP machine, the patient reported waking up with muscle tightness and shortness of breath, describing these episodes as "bad spells." These episodes were characterized by tossing and turning in bed, tightness in leg and body muscles, and a cough upon waking. The patient reported experiencing three such episodes in the past week. He wears supplemental oxygen at 3 liters per night, which is blended into CPAP machine. He sleeps at an incline using two pillows under memory foam mattress.   He also reports experiencing shortness of breath and chest tightness during the day, particularly during exertion. The patient was previously prescribed Singulair (montelukast) but the medication had run out.   The patient's weight has remained relatively stable. He takes diuretic as needed for fluid retention.  He had an echocardiogram back in 2022 that showed grade 2 diastolic dysfunction, he is overdue for follow-up with his  cardiologist.     [employer] download 05/04/23-06/02/23 Usage 30/30 days Average usage 7 hours 26 Pressure 10-16cm h20 (11.5cm h20) Aitrleaks 11.6 AHI 1.0   Allergies  Allergen Reactions   Shellfish Allergy Nausea Only   Codeine Other (See Comments)    Nervous   Sulfa Antibiotics Rash and Other (See Comments)    Immunization History  Administered Date(s) Administered    Influenza,inj,Quad PF,6+ Mos 05/18/2018   Influenza-Unspecified 05/22/2019, 04/08/2020, 04/04/2023    Past Medical History:  Diagnosis Date   A-fib (HCC)    Acute gastritis with hemorrhage    Allergy    Cardiomegaly    Cervical spondylosis without myelopathy    CHF (congestive heart failure) (HCC)    Chronic kidney disease    stage 3    Depression    Gout    Hiatal hernia    Hypertension    Impotence of organic origin    Leaky heart valve    Lumbago    Migraine without aura, without mention of intractable migraine without mention of status migrainosus    Oxygen deficiency    on 3 Liters 02 with CPAP only    Pure hyperglyceridemia    Renal insufficiency    Sleep apnea    wears cpap with 3 L 02 at night     Tobacco History: Social History   Tobacco Use  Smoking Status Never  Smokeless Tobacco Never   Counseling given: Not Answered   Outpatient Medications Prior to Visit  Medication Sig Dispense Refill   allopurinol (ZYLOPRIM) 100 MG tablet Take 100 mg by mouth daily.      allopurinol (ZYLOPRIM) 300 MG tablet Take 300 mg by mouth daily.      aspirin EC 81 MG tablet Take 81 mg by mouth daily.     clonazePAM (KLONOPIN) 0.5 MG tablet Take 0.5 mg by mouth 2 (two) times daily as needed.     colchicine 0.6 MG tablet TAKE 1 TABLET (0.6 MG TOTAL) BY MOUTH 2 (TWO) TIMES DAILY AS NEEDED. 60 tablet 1   flecainide (TAMBOCOR) 50 MG tablet Take 1 tablet (50 mg total) by mouth 2 (two) times daily as needed. 60 tablet 1   furosemide (LASIX) 20 MG tablet Take 1 tablet (20 mg total) by mouth as needed (for fullness and/or leg swelling). 30 tablet 1   ipratropium (ATROVENT) 0.06 % nasal spray Place 1 spray into both nostrils 3 (three) times daily.     lisinopril-hydrochlorothiazide (ZESTORETIC) 10-12.5 MG tablet Take 1 tablet by mouth daily. 90 tablet 2   nitroGLYCERIN (NITROSTAT) 0.4 MG SL tablet Place under the tongue.     NON FORMULARY CPAP DAILY     NON FORMULARY Oxygen 3 Liters  bled into CPAP     cetirizine (ZYRTEC) 10 MG tablet Take 10 mg by mouth daily. (Patient not taking: Reported on 09/29/2022)     montelukast (SINGULAIR) 10 MG tablet Take 10 mg by mouth daily. (Patient not taking: Reported on 09/29/2022)     sildenafil (REVATIO) 20 MG tablet Take 1 tablet (20 mg total) by mouth 3 (three) times daily. (Patient not taking: Reported on 06/04/2023) 90 tablet 0   silodosin (RAPAFLO) 8 MG CAPS capsule TAKE 1 CAPSULE BY MOUTH DAILY WITH BREAKFAST. (Patient not taking: Reported on 09/29/2022) 30 capsule 0   No facility-administered medications prior to visit.   Review of Systems  Review of Systems  Constitutional: Negative.   HENT: Negative.    Respiratory:  Positive for shortness of breath.  Negative for cough.   Psychiatric/Behavioral:  Positive for sleep disturbance.    Physical Exam  BP (!) 138/90 (BP Location: Right Arm, Cuff Size: Large)   Pulse 73   Temp 98.4 F (36.9 C)   Ht 5\' 9"  (1.753 m)   Wt 235 lb 3.2 oz (106.7 kg)   SpO2 100%   BMI 34.73 kg/m  Physical Exam Constitutional:      Appearance: Normal appearance. He is obese.  Cardiovascular:     Rate and Rhythm: Normal rate and regular rhythm.  Pulmonary:     Effort: Pulmonary effort is normal.     Breath sounds: Normal breath sounds.  Abdominal:     General: There is distension.  Skin:    General: Skin is warm and dry.  Neurological:     General: No focal deficit present.     Mental Status: He is alert and oriented to person, place, and time. Mental status is at baseline.  Psychiatric:        Mood and Affect: Mood normal.        Behavior: Behavior normal.        Thought Content: Thought content normal.        Judgment: Judgment normal.      Lab Results:  CBC    Component Value Date/Time   WBC 4.7 02/16/2020 1726   RBC 4.27 02/16/2020 1726   HGB 13.4 02/16/2020 1726   HGB 14.0 01/03/2014 1254   HCT 36.8 (L) 02/16/2020 1726   HCT 40.8 01/03/2014 1254   PLT 171 02/16/2020 1726    PLT 176 01/03/2014 1254   MCV 86.2 02/16/2020 1726   MCV 87 01/03/2014 1254   MCH 31.4 02/16/2020 1726   MCHC 36.4 (H) 02/16/2020 1726   RDW 13.9 02/16/2020 1726   RDW 13.3 01/03/2014 1254   LYMPHSABS 1.3 02/16/2020 1726   LYMPHSABS 1.9 09/29/2013 1444   MONOABS 0.4 02/16/2020 1726   EOSABS 0.1 02/16/2020 1726   EOSABS 0.1 09/29/2013 1444   BASOSABS 0.0 02/16/2020 1726   BASOSABS 0.0 09/29/2013 1444    BMET    Component Value Date/Time   NA 137 01/03/2014 1254   K 3.9 01/03/2014 1254   CL 103 01/03/2014 1254   CO2 30 01/03/2014 1254   GLUCOSE 97 01/03/2014 1254   BUN 11 01/03/2014 1254   CREATININE 1.70 (H) 03/08/2020 1110   CREATININE 1.41 (H) 01/03/2014 1254   CALCIUM 8.8 01/03/2014 1254   GFRNONAA 58 (L) 01/03/2014 1254   GFRAA >60 01/03/2014 1254    BNP No results found for: "BNP"  ProBNP    Component Value Date/Time   PROBNP 6.8 09/25/2010 1414    Imaging: No results found.   Assessment & Plan:    1. Shortness of breath - Pulse oximetry, overnight; Future - Pulmonary Function Test [facility] Only; Future  2. OSA on CPAP - Pulse oximetry, overnight; Future - AMB REFERRAL FOR DME  3. Chronic diastolic heart failure (HCC)   Obstructive Sleep Apnea Patient reports waking up with muscle tightness and shortness of breath despite consistent use of CPAP. CPAP download shows good compliance and well-controlled apnea-hypopnea index. Patient also uses supplemental oxygen at 3 liters at night. -Adjust CPAP from auto settings to a set pressure of 11cm h20 -Recommend use of a wedge pillow for sleep. -Order overnight oximetry test on 3 liters of oxygen with CPAP to check oxygen levels.  Dyspnea/ Potential Asthma Patient reports shortness of breath and chest tightness with exertion during  the day. Previous lung function test from 2017 showed potential for mild asthma. Oxygen level did not desaturate on ambulatory walk test today  -Order updated lung function  test. -Prescribe albuterol rescue inhaler for use as needed prior to activities causing shortness of breath. -Refill montelukast (Singulair) prescription.  Diastolic Heart Failure Patient has known left ventricular hypertrophy and reports occasional leg swelling. Currently takes hydrochlorothiazide as part of blood pressure medication and Lasix as needed. -Recommend more regular use of Lasix  x 1 week (e.g., Monday, Wednesday, Friday). -Advise follow-up with cardiologist.  Follow-up in 3 months to reassess after breathing test, use of rescue inhaler, and adjustments to CPAP and Lasix regimen.         Glenford Bayley, NP 06/04/2023

## 2023-06-04 NOTE — Patient Instructions (Addendum)
Recommendations: Take lasix MWF for the next week  Follow up with cardiology due to diastolic dysfunction/heart failure  Continue to wear CPAP nightly Try sleeping with wedge pillow We will adjust CPAP to set pressure 11cm h20  Use Albuterol 2 puffs every 4-6 hours for breakthrough shortness of breath/wheezing   Orders: Overnight oximetry test (ordered)  Pulmonary function testing  DME order- change CPAP pressure 11cm h20   Follow-up 3 months with Dr. Jayme Cloud or [name] NP

## 2023-06-07 NOTE — Progress Notes (Signed)
Agree with the details of the visit as noted by Elizabeth Walsh, NP.  C. Laura Tonye Tancredi, MD [facility] 

## 2023-06-16 ENCOUNTER — Telehealth: Payer: Self-pay | Admitting: Primary Care

## 2023-06-16 NOTE — Telephone Encounter (Signed)
I have notified the patient's wife(DPR).   Nothing further needed.

## 2023-06-16 NOTE — Telephone Encounter (Signed)
Overnight oximetry test on CPAP at 3 L showed SpO2 low 88%, baseline 95%.  He spent 0 minutes with an oxygen level less than 88%.  No changes recommended.  Continue oxygen at 3 L with CPAP at night.

## 2023-07-05 ENCOUNTER — Telehealth: Payer: Self-pay | Admitting: Primary Care

## 2023-07-05 NOTE — Telephone Encounter (Addendum)
Mrs. Hemme advised of overnight oximetry. Nothing further needed.   Glenford Bayley, NP    06/16/23  9:52 AM Note Overnight oximetry test on CPAP at 3 L showed SpO2 low 88%, baseline 95%.  He spent 0 minutes with an oxygen level less than 88%.  No changes recommended.  Continue oxygen at 3 L with CPAP at night.

## 2023-07-05 NOTE — Telephone Encounter (Signed)
Patient's wife states that she did not receive the results. Please call her back to review the results of husbands overnight oximetry.

## 2023-07-30 ENCOUNTER — Encounter: Payer: Self-pay | Admitting: Primary Care

## 2023-09-07 ENCOUNTER — Encounter: Payer: Self-pay | Admitting: Pulmonary Disease

## 2023-09-07 ENCOUNTER — Ambulatory Visit: Payer: 59 | Admitting: Pulmonary Disease

## 2023-09-07 VITALS — BP 118/80 | HR 87 | Temp 97.1°F | Ht 69.0 in | Wt 238.4 lb

## 2023-09-07 DIAGNOSIS — J208 Acute bronchitis due to other specified organisms: Secondary | ICD-10-CM | POA: Diagnosis not present

## 2023-09-07 DIAGNOSIS — E860 Dehydration: Secondary | ICD-10-CM

## 2023-09-07 DIAGNOSIS — R062 Wheezing: Secondary | ICD-10-CM

## 2023-09-07 DIAGNOSIS — B338 Other specified viral diseases: Secondary | ICD-10-CM

## 2023-09-07 DIAGNOSIS — B9689 Other specified bacterial agents as the cause of diseases classified elsewhere: Secondary | ICD-10-CM | POA: Diagnosis not present

## 2023-09-07 MED ORDER — LEVALBUTEROL TARTRATE 45 MCG/ACT IN AERO: 2.0000 | INHALATION_SPRAY | Freq: Four times a day (QID) | RESPIRATORY_TRACT | 2 refills | Status: DC | PRN

## 2023-09-07 MED ORDER — AZITHROMYCIN 500 MG PO TABS: 500.0000 mg | ORAL_TABLET | Freq: Every day | ORAL | 0 refills | Status: AC

## 2023-09-07 MED ORDER — IPRATROPIUM-ALBUTEROL 0.5-2.5 (3) MG/3ML IN SOLN
3.0000 mL | Freq: Once | RESPIRATORY_TRACT | Status: AC
  Administered 2023-09-07: 3 mL via RESPIRATORY_TRACT

## 2023-09-07 NOTE — Progress Notes (Signed)
 Subjective:    Patient ID: Randy Schneider, male    DOB: May 23, 1966, 58 y.o.   MRN: [id number]  Patient Care Team: Montey Lot, PA-C as PCP - General (Physician Assistant) Perla Evalene PARAS, MD as PCP - Cardiology (Cardiology) Dessa, Randy ORN, MD (General Surgery) Hamrick, Charlene CROME, MD (Family Medicine)  Chief Complaint  Patient presents with   Follow-up    DOE. Wheezing. Cough with green sputum. CPAP is better since settings were changed.    BACKGROUND: I have not seen this patient since I of 2022, I was following him at that time for issues with dyspnea.  He presents today for follow-up on CPAP for OSA.  He had been following with our sleep providers.  He also has issues with recent RSV infection.   HPI Discussed the use of AI scribe software for clinical note transcription with the patient, who gave verbal consent to proceed.  History of Present Illness   Randy Schneider is a 58 year old male with heart diastolic dysfunction and mitral regurgitation who presents with persistent respiratory symptoms following RSV infection.  He has been experiencing persistent respiratory symptoms following an RSV infection which was diagnosed last week.  Symptoms include a cough productive of green sputum and a sensation of being 'starved for breath' with tachypalpitations and and increased pulse rate of 127-130 beats per minute. Despite treatment with cefdinir and cough medicine, prescribed by urgent care, his symptoms have not improved.  He experiences lightheadedness upon standing and a sensation of nearly passing out when coughing. These symptoms have been concerning, especially in the context of his respiratory issues.  He admits not keeping up with fluids during this illness.  He has a history of diastolic dysfunction and mitral regurgitation, for which he is under the care of a cardiologist. He is scheduled to follow up with the cardiologist later this month. A past pulmonary function  test in 2017 indicated difficulty exhaling.  He monitors his blood pressure at home, noting it was elevated last week, with readings around 160s/98-100 mmHg. This is relevant given his history of hypertension.  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 He does not have any inhalers at home, which may be relevant to his current respiratory condition.  He is allergic to codeine, sulfa antibiotics, and shellfish.      Review of his compliance download for CPAP shows that he has an overall compliance of 83% and usage over 4 hours/day compliance of 73%.  He is on CPAP at 11 cm H2O.  Residual AHI is 1.1.  Review of Systems A 10 point review of systems was performed and it is as noted above otherwise negative.   Past Medical History:  Diagnosis Date   A-fib (HCC)    Acute gastritis with hemorrhage    Allergy    Cardiomegaly    Cervical spondylosis without myelopathy    CHF (congestive heart failure) (HCC)    Chronic kidney disease    stage 3    Depression    Gout    Hiatal hernia    Hypertension    Impotence of organic origin    Leaky heart valve    Lumbago    Migraine without aura, without mention of intractable migraine without mention of status migrainosus    Oxygen deficiency    on 3 Liters 02 with CPAP only    Pure hyperglyceridemia    Renal insufficiency    Sleep apnea  wears cpap with 3 L 02 at night     Past Surgical History:  Procedure Laterality Date   BACK SURGERY  1999   CARPAL TUNNEL RELEASE Right 1994   COLONOSCOPY     age 57 for bleeding - normal    NASAL SINUS SURGERY  2012   ROTATOR CUFF REPAIR Left 2003   UPPER GASTROINTESTINAL ENDOSCOPY     age 67- for bleeding     Patient Active Problem List   Diagnosis Date Noted   OSA on CPAP 04/02/2017   Leg pain 10/30/2014   Gout of big toe 01/30/2014   Weakness 01/30/2014   Atrial fibrillation (HCC) 10/11/2013   Rectus diastasis 08/28/2013   Umbilical hernia 08/28/2013    HYPERTRIGLYCERIDEMIA 09/25/2010   DEPRESSION 09/25/2010   ESSENTIAL HYPERTENSION, BENIGN 09/25/2010   Allergic rhinitis 09/25/2010   SPONDYLOSIS, CERVICAL 09/25/2010   UNSPECIFIED TACHYCARDIA 09/25/2010   Atrophic gastritis 09/24/2010   IMPOTENCE OF ORGANIC ORIGIN 09/24/2010   LOW BACK PAIN, ACUTE 09/24/2010   Dyspnea 09/24/2010   Chest pain 09/24/2010    Family History  Problem Relation Age of Onset   Heart attack Mother    Leukemia Mother    Heart attack Father    Heart failure Father    Arrhythmia Sister        A-Fib   Heart disease Brother    Arrhythmia Brother        A-Fib   Colon polyps Neg Hx    Colon cancer Neg Hx    Esophageal cancer Neg Hx    Stomach cancer Neg Hx    Rectal cancer Neg Hx     Social History   Tobacco Use   Smoking status: Never   Smokeless tobacco: Never  Substance Use Topics   Alcohol use: Yes    Comment: very rare     Allergies  Allergen Reactions   Shellfish Allergy Nausea Only   Codeine Other (See Comments)    Nervous   Sulfa Antibiotics Rash and Other (See Comments)    Current Meds  Medication Sig   allopurinol (ZYLOPRIM) 100 MG tablet Take 100 mg by mouth daily.    allopurinol (ZYLOPRIM) 300 MG tablet Take 300 mg by mouth daily.    aspirin EC 81 MG tablet Take 81 mg by mouth daily.   azithromycin  (ZITHROMAX ) 500 MG tablet Take 1 tablet (500 mg total) by mouth daily for 3 days. Take 1 tablet daily for 3 days.   cefdinir (OMNICEF) 300 MG capsule Take 300 mg by mouth 2 (two) times daily.   clonazePAM (KLONOPIN) 0.5 MG tablet Take 0.5 mg by mouth 2 (two) times daily as needed.   flecainide  (TAMBOCOR ) 50 MG tablet Take 1 tablet (50 mg total) by mouth 2 (two) times daily as needed.   furosemide  (LASIX ) 20 MG tablet Take 1 tablet (20 mg total) by mouth as needed (for fullness and/or leg swelling).   ipratropium (ATROVENT) 0.06 % nasal spray Place 1 spray into both nostrils 3 (three) times daily.   levalbuterol  (XOPENEX  HFA) 45  MCG/ACT inhaler Inhale 2 puffs into the lungs every 6 (six) hours as needed for wheezing or shortness of breath.   lisinopril -hydrochlorothiazide  (ZESTORETIC ) 10-12.5 MG tablet Take 1 tablet by mouth daily.   montelukast  (SINGULAIR ) 10 MG tablet Take 1 tablet (10 mg total) by mouth daily.   nitroGLYCERIN  (NITROSTAT ) 0.4 MG SL tablet Place under the tongue.   NON FORMULARY CPAP DAILY   NON FORMULARY Oxygen 3 Liters bled  into CPAP   promethazine-dextromethorphan (PROMETHAZINE-DM) 6.25-15 MG/5ML syrup Take 5 mLs by mouth every 6 (six) hours as needed.   sildenafil  (REVATIO ) 20 MG tablet Take 1 tablet (20 mg total) by mouth 3 (three) times daily.   silodosin  (RAPAFLO ) 8 MG CAPS capsule TAKE 1 CAPSULE BY MOUTH DAILY WITH BREAKFAST.    Immunization History  Administered Date(s) Administered   Influenza,inj,Quad PF,6+ Mos 05/18/2018   Influenza-Unspecified 05/22/2019, 04/08/2020, 04/04/2023        Objective:     BP 118/80 (BP Location: Right Arm, Cuff Size: Large)   Pulse 87   Temp (!) 97.1 F (36.2 C)   Ht 5' 9 (1.753 m)   Wt 238 lb 6.4 oz (108.1 kg)   SpO2 100%   BMI 35.21 kg/m   SpO2: 100 % O2 Device: None (Room air)  GENERAL: Obese gentleman, no acute distress, mild psychomotor retardation HEAD: Normocephalic, atraumatic.  EYES: Pupils equal, round, reactive to light.  No scleral icterus.  MOUTH: Nose/mouth/throat not examined due to masking requirements for COVID 19. NECK: Supple. No thyromegaly. Trachea midline. No JVD.  No adenopathy. PULMONARY: Good air entry bilaterally.  Coarse with mild end expiratory wheezing. CARDIOVASCULAR: S1 and S2. Regular rate and rhythm.  Grade 2/6 systolic ejection murmur left sternal border. ABDOMEN: Obese, otherwise benign. MUSCULOSKELETAL: No joint deformity, no clubbing, no edema.  NEUROLOGIC: No focal deficit, no gait disturbance, speech is fluent. SKIN: Intact,warm,dry.  On limited exam no rashes PSYCH: Flat affect, mild psychomotor  retardation.   Patient received nebulizer treatment with DuoNeb improving on his bronchospasm and shortness of breath.      Assessment & Plan:     ICD-10-CM   1. Acute bacterial bronchitis  J20.8    B96.89     2. Dehydration  E86.0     3. RSV infection  B33.8     4. Wheezing  R06.2 ipratropium-albuterol  (DUONEB) 0.5-2.5 (3) MG/3ML nebulizer solution 3 mL     Meds ordered this encounter  Medications   ipratropium-albuterol  (DUONEB) 0.5-2.5 (3) MG/3ML nebulizer solution 3 mL   azithromycin  (ZITHROMAX ) 500 MG tablet    Sig: Take 1 tablet (500 mg total) by mouth daily for 3 days. Take 1 tablet daily for 3 days.    Dispense:  3 tablet    Refill:  0   levalbuterol  (XOPENEX  HFA) 45 MCG/ACT inhaler    Sig: Inhale 2 puffs into the lungs every 6 (six) hours as needed for wheezing or shortness of breath.    Dispense:  1 each    Refill:  2   Discussion:    Respiratory Syncytial Virus (RSV) Infection/Acute Bacterial Bronchitis RSV infection diagnosed last week.  Suspect now bacterial infection following viral illness.  Reports persistent lightheadedness, productive cough with green sputum, and dyspnea. Wheezing noted on examination. Likely dehydration contributing to lightheadedness. Current antibiotic (cefdinir) may not cover the likely pathogens. Discussed need for hydration with electrolytes and potential benefits of a different antibiotic. Explained that symptoms may linger and importance of completing current antibiotic course. - Administer nebulizer treatment, patient noted improvement afterwards - Prescribe levo albuterol  inhaler - Prescribe azithromycin  to cover atypicals - Advise hydration with low sugar Gatorade or Pedialyte - Follow-up in 3-4 weeks  Heart Failure with Preserved Ejection Fraction (HFpEF) and Mitral Valve Regurgitation HFpEF and mitral valve regurgitation. Reports high pulse rate during dyspnea episodes. Follow-up with cardiologist pending. Discussed importance  of rescheduling missed tests from November once RSV symptoms resolve. - Ensure follow-up with cardiologist  this month - Reschedule missed tests from November once RSV symptoms resolve  Hypertension Reports high blood pressure readings last week (160s/98-100). Current blood pressure is well-controlled. Advised to adjust antihypertensive medication based on hydration status. Discussed importance of home blood pressure monitoring and medication adjustment. - Adjust antihypertensive medication based on hydration status - Monitor blood pressure at home  General Health Maintenance Discussed hydration and medication management in the context of general health maintenance. - Advise to drink plenty of fluids with electrolytes - Monitor blood pressure at home  Follow-up - Follow-up in 3-4 weeks - Ensure follow-up with cardiologist this month - Reschedule missed tests from November once RSV symptoms resolve.     Advised if symptoms do not improve or worsen, to please contact office for sooner follow up or seek emergency care.    I spent 52 minutes of dedicated to the care of this patient on the date of this encounter to include pre-visit review of records, face-to-face time with the patient discussing conditions above, post visit ordering of testing, clinical documentation with the electronic health record, making appropriate referrals as documented, and communicating necessary findings to members of the patients care team.   C. Leita Sanders, MD Advanced Bronchoscopy [facility]    *This note was dictated using voice recognition software/Dragon.  Despite best efforts to proofread, errors can occur which can change the meaning. Any transcriptional errors that result from this process are unintentional and may not be fully corrected at the time of dictation.

## 2023-09-07 NOTE — Patient Instructions (Signed)
 VISIT SUMMARY:  During today's visit, we discussed your ongoing respiratory symptoms following an RSV infection, including your productive cough, lightheadedness, and high pulse rate. We also reviewed your heart condition and recent high blood pressure readings. A new treatment plan was established to address these issues.  YOUR PLAN:  -RESPIRATORY SYNCYTIAL VIRUS (RSV) INFECTION: RSV is a viral infection that affects the respiratory system. Despite your current treatment, your symptoms have persisted. We will administer a nebulizer treatment today, prescribe an albuterol  inhaler, and switch to a different antibiotic. It's important to stay hydrated with low sugar Gatorade or Pedialyte. Please complete your current antibiotic course and follow up in 3-4 weeks.  -HEART FAILURE WITH PRESERVED EJECTION FRACTION (HFPEF) AND MITRAL VALVE REGURGITATION: HFpEF means your heart has difficulty relaxing and filling with blood, and mitral valve regurgitation means one of your heart valves is leaking. We discussed the importance of following up with your cardiologist this month and rescheduling any missed tests from November once your RSV symptoms resolve.  -HYPERTENSION: Hypertension is high blood pressure. Your recent readings were high, but your current blood pressure is well-controlled. We discussed adjusting your antihypertensive medication based on your hydration status and the importance of monitoring your blood pressure at home.  -GENERAL HEALTH MAINTENANCE: We discussed the importance of staying hydrated and managing your medications properly. Please drink plenty of fluids with electrolytes and continue to monitor your blood pressure at home.  INSTRUCTIONS:  Please follow up in 3-4 weeks. Ensure you follow up with your cardiologist this month and reschedule any missed tests from November once your RSV symptoms resolve.

## 2023-09-08 ENCOUNTER — Encounter: Payer: Self-pay | Admitting: Pulmonary Disease

## 2023-09-30 ENCOUNTER — Ambulatory Visit: Payer: 59 | Admitting: Pulmonary Disease

## 2023-10-03 ENCOUNTER — Other Ambulatory Visit: Payer: Self-pay | Admitting: Cardiovascular Disease

## 2023-10-07 ENCOUNTER — Ambulatory Visit: Payer: 59 | Admitting: Pulmonary Disease

## 2023-10-21 ENCOUNTER — Ambulatory Visit: Payer: 59 | Attending: Primary Care

## 2023-10-21 DIAGNOSIS — R0602 Shortness of breath: Secondary | ICD-10-CM | POA: Insufficient documentation

## 2023-10-21 LAB — PULMONARY FUNCTION TEST ARMC ONLY
DL/VA % pred: 33 %
DL/VA: 1.45 ml/min/mmHg/L
DLCO unc % pred: 10 %
DLCO unc: 2.82 ml/min/mmHg
FEF 25-75 Post: 0.86 L/s
FEF 25-75 Pre: 0.84 L/s
FEF2575-%Change-Post: 1 %
FEF2575-%Pred-Post: 28 %
FEF2575-%Pred-Pre: 28 %
FEV1-%Change-Post: -3 %
FEV1-%Pred-Post: 35 %
FEV1-%Pred-Pre: 36 %
FEV1-Post: 1.27 L
FEV1-Pre: 1.31 L
FEV1FVC-%Change-Post: -11 %
FEV1FVC-%Pred-Pre: 66 %
FEV6-%Change-Post: 7 %
FEV6-%Pred-Post: 62 %
FEV6-%Pred-Pre: 58 %
FEV6-Post: 2.77 L
FEV6-Pre: 2.59 L
FEV6FVC-%Change-Post: -2 %
FEV6FVC-%Pred-Post: 102 %
FEV6FVC-%Pred-Pre: 104 %
FVC-%Change-Post: 9 %
FVC-%Pred-Post: 60 %
FVC-%Pred-Pre: 55 %
FVC-Post: 2.84 L
FVC-Pre: 2.59 L
Post FEV1/FVC ratio: 45 %
Post FEV6/FVC ratio: 98 %
Pre FEV1/FVC ratio: 51 %
Pre FEV6/FVC Ratio: 100 %
RV % pred: 100 %
RV: 2.17 L
TLC % pred: 60 %
TLC: 4.1 L

## 2023-10-21 MED ORDER — ALBUTEROL SULFATE (2.5 MG/3ML) 0.083% IN NEBU
2.5000 mg | INHALATION_SOLUTION | Freq: Once | RESPIRATORY_TRACT | Status: AC
  Administered 2023-10-21: 2.5 mg via RESPIRATORY_TRACT
  Filled 2023-10-21: qty 3

## 2023-10-22 ENCOUNTER — Ambulatory Visit
Admission: RE | Admit: 2023-10-22 | Discharge: 2023-10-22 | Disposition: A | Discharge status: Home / Self Care | Source: Ambulatory Visit | Attending: Pulmonary Disease | Admitting: Pulmonary Disease

## 2023-10-22 ENCOUNTER — Other Ambulatory Visit
Admission: RE | Admit: 2023-10-22 | Discharge: 2023-10-22 | Disposition: A | Discharge status: Home / Self Care | Source: Ambulatory Visit | Attending: Pulmonary Disease | Admitting: Pulmonary Disease

## 2023-10-22 ENCOUNTER — Encounter: Payer: Self-pay | Admitting: Pulmonary Disease

## 2023-10-22 ENCOUNTER — Ambulatory Visit: Payer: 59 | Admitting: Pulmonary Disease

## 2023-10-22 VITALS — BP 124/84 | HR 62 | Temp 98.0°F | Ht 69.0 in | Wt 235.2 lb

## 2023-10-22 DIAGNOSIS — G4733 Obstructive sleep apnea (adult) (pediatric): Secondary | ICD-10-CM | POA: Diagnosis not present

## 2023-10-22 DIAGNOSIS — G9331 Postviral fatigue syndrome: Secondary | ICD-10-CM

## 2023-10-22 DIAGNOSIS — L4050 Arthropathic psoriasis, unspecified: Secondary | ICD-10-CM | POA: Diagnosis not present

## 2023-10-22 DIAGNOSIS — M109 Gout, unspecified: Secondary | ICD-10-CM

## 2023-10-22 DIAGNOSIS — R0602 Shortness of breath: Secondary | ICD-10-CM

## 2023-10-22 LAB — COMPREHENSIVE METABOLIC PANEL
ALT: 20 U/L (ref 0–44)
AST: 18 U/L (ref 15–41)
Albumin: 4.2 g/dL (ref 3.5–5.0)
Alkaline Phosphatase: 48 U/L (ref 38–126)
Anion gap: 7 (ref 5–15)
BUN: 19 mg/dL (ref 6–20)
CO2: 26 mmol/L (ref 22–32)
Calcium: 9.2 mg/dL (ref 8.9–10.3)
Chloride: 107 mmol/L (ref 98–111)
Creatinine, Ser: 1.26 mg/dL — ABNORMAL HIGH (ref 0.61–1.24)
GFR, Estimated: >60 mL/min (ref >60)
Glucose, Bld: 107 mg/dL — ABNORMAL HIGH (ref 70–99)
Potassium: 4.3 mmol/L (ref 3.5–5.1)
Sodium: 140 mmol/L (ref 135–145)
Total Bilirubin: 0.8 mg/dL (ref 0.0–1.2)
Total Protein: 6.9 g/dL (ref 6.5–8.1)

## 2023-10-22 LAB — CBC WITH DIFFERENTIAL/PLATELET
Abs Immature Granulocytes: 0.01 10*3/uL (ref 0.00–0.07)
Basophils Absolute: 0 10*3/uL (ref 0.0–0.1)
Basophils Relative: 1 %
Eosinophils Absolute: 0.1 10*3/uL (ref 0.0–0.5)
Eosinophils Relative: 2 %
HCT: 41.7 % (ref 39.0–52.0)
Hemoglobin: 14.6 g/dL (ref 13.0–17.0)
Immature Granulocytes: 0 %
Lymphocytes Relative: 30 %
Lymphs Abs: 1.5 10*3/uL (ref 0.7–4.0)
MCH: 31.5 pg (ref 26.0–34.0)
MCHC: 35 g/dL (ref 30.0–36.0)
MCV: 89.9 fL (ref 80.0–100.0)
Monocytes Absolute: 0.3 10*3/uL (ref 0.1–1.0)
Monocytes Relative: 7 %
Neutro Abs: 2.9 10*3/uL (ref 1.7–7.7)
Neutrophils Relative %: 60 %
Platelets: 182 10*3/uL (ref 150–400)
RBC: 4.64 MIL/uL (ref 4.22–5.81)
RDW: 13.2 % (ref 11.5–15.5)
WBC: 4.8 10*3/uL (ref 4.0–10.5)
nRBC: 0 % (ref 0.0–0.2)

## 2023-10-22 LAB — T4, FREE: Free T4: 0.88 ng/dL (ref 0.61–1.12)

## 2023-10-22 LAB — TSH: TSH: 1.625 u[IU]/mL (ref 0.350–4.500)

## 2023-10-22 NOTE — Progress Notes (Signed)
 Subjective:    Patient ID: Randy Schneider, male    DOB: 18-Jun-1966, 58 y.o.   MRN: [id number]  Patient Care Team: Lonie Peak, PA-C as PCP - General (Physician Assistant) Antonieta Iba, MD as PCP - Cardiology (Cardiology) Lemar Livings Merrily Pew, MD (General Surgery) Hamrick, Durward Fortes, MD (Family Medicine) Salena Saner, MD as Consulting Physician (Pulmonary Disease)  Chief Complaint  Patient presents with   Follow-up    DOB. Some wheezing. Cough with green sputum since having RSV     BACKGROUND/INTERVAL: Patient is a 58 year old lifelong never smoker who presents for follow-up on fatigue and ongoing cough after RSV infection in January.  HPI Discussed the use of AI scribe software for clinical note transcription with the patient, who gave verbal consent to proceed.  History of Present Illness   Randy Schneider is a 58 year old male who presents with dyspnea following an RSV infection.  He has been experiencing significant fatigue and dyspnea since an RSV infection at the end of January. He describes feeling 'worn out' and having 'no energy' for months. His breathing difficulties persist, and he feels unable to exert himself fully during breathing tests.  He has a history of sleep apnea and uses a CPAP machine. During the RSV infection, he was unable to use the CPAP and experienced episodes of waking up feeling like he was 'choking to death.' Adjustments made by Buelah Manis, NP and have since resolved these episodes. His CPAP is self-adjusting and provided by [facility], and he has been purchasing supplies independently to save costs.  No recent chest pain, but his oxygen levels drop to 90-91% when sitting on the couch in the evenings, rising to 97-98% when active.  This is indicative of atelectasis when he is recumbent and normal pulmonary function with activity.  He experiences a sensation of being unable to get air in when exerting himself outside.  He has a history of working in  [employer] for 43 years, which involved exposure to airborne preservatives and chemicals. He has smoked minimally in his life but has used marijuana.  He also reports a history of gout and arthritis, which he manages with a rheumatologist, Dr. Allena Katz, although he missed his last appointment due to illness.  He experiences excessive daytime somnolence, often falling asleep when sitting down, and feels more tired upon waking than when he went to bed. He has not seen his primary care provider at [facility], for a while and has been unable to schedule a physical due to her being booked. His last thyroid function test was last year, and it was normal at that time.     He appears depressed.  He had pulmonary function testing attempted yesterday however, the testing did not meet ATS standards as the patient did not follow instructions nor gave best effort.  Consider the test invalid.  CPAP compliance was only at 63% however this was also during the episode of RSV that the patient was unable to use the device.  Compliance has improved after RSV infection.   Review of Systems A 10 point review of systems was performed and it is as noted above otherwise negative.   Patient Active Problem List   Diagnosis Date Noted   OSA on CPAP 04/02/2017   Leg pain 10/30/2014   Gout of big toe 01/30/2014   Weakness 01/30/2014   Atrial fibrillation (HCC) 10/11/2013   Rectus diastasis 08/28/2013   Umbilical hernia 08/28/2013   HYPERTRIGLYCERIDEMIA 09/25/2010  DEPRESSION 09/25/2010   ESSENTIAL HYPERTENSION, BENIGN 09/25/2010   Allergic rhinitis 09/25/2010   SPONDYLOSIS, CERVICAL 09/25/2010   UNSPECIFIED TACHYCARDIA 09/25/2010   Atrophic gastritis 09/24/2010   IMPOTENCE OF ORGANIC ORIGIN 09/24/2010   LOW BACK PAIN, ACUTE 09/24/2010   Dyspnea 09/24/2010   Chest pain 09/24/2010    Social History   Tobacco Use   Smoking status: Never   Smokeless tobacco: Never  Substance Use Topics    Alcohol use: Yes    Comment: very rare     Allergies  Allergen Reactions   Shellfish Allergy Nausea Only   Codeine Other (See Comments)    Nervous   Sulfa Antibiotics Rash and Other (See Comments)    Current Meds  Medication Sig   allopurinol (ZYLOPRIM) 100 MG tablet Take 100 mg by mouth daily.    allopurinol (ZYLOPRIM) 300 MG tablet Take 300 mg by mouth daily.    aspirin EC 81 MG tablet Take 81 mg by mouth daily.   clonazePAM (KLONOPIN) 0.5 MG tablet Take 0.5 mg by mouth 2 (two) times daily as needed.   colchicine 0.6 MG tablet TAKE 1 TABLET (0.6 MG TOTAL) BY MOUTH 2 (TWO) TIMES DAILY AS NEEDED.   flecainide (TAMBOCOR) 50 MG tablet Take 1 tablet (50 mg total) by mouth 2 (two) times daily as needed.   furosemide (LASIX) 20 MG tablet Take 1 tablet (20 mg total) by mouth as needed (for fullness and/or leg swelling).   ipratropium (ATROVENT) 0.06 % nasal spray Place 1 spray into both nostrils 3 (three) times daily.   levalbuterol (XOPENEX HFA) 45 MCG/ACT inhaler Inhale 2 puffs into the lungs every 6 (six) hours as needed for wheezing or shortness of breath.   lisinopril-hydrochlorothiazide (ZESTORETIC) 10-12.5 MG tablet TAKE 1 TABLET BY MOUTH EVERY DAY   montelukast (SINGULAIR) 10 MG tablet Take 1 tablet (10 mg total) by mouth daily.   nitroGLYCERIN (NITROSTAT) 0.4 MG SL tablet Place under the tongue.   NON FORMULARY CPAP DAILY   NON FORMULARY Oxygen 3 Liters bled into CPAP   sildenafil (REVATIO) 20 MG tablet Take 1 tablet (20 mg total) by mouth 3 (three) times daily.   silodosin (RAPAFLO) 8 MG CAPS capsule TAKE 1 CAPSULE BY MOUTH DAILY WITH BREAKFAST.    Immunization History  Administered Date(s) Administered   Influenza,inj,Quad PF,6+ Mos 05/18/2018   Influenza-Unspecified 05/22/2019, 04/08/2020, 04/04/2023   Zoster Recombinant(Shingrix) 10/16/2022        Objective:     BP 124/84 (BP Location: Right Arm, Cuff Size: Large)   Pulse 62   Temp 98 F (36.7 C)   Ht 5\' 9"   (1.753 m)   Wt 235 lb 3.2 oz (106.7 kg)   SpO2 100%   BMI 34.73 kg/m   SpO2: 100 % O2 Device: None (Room air)  GENERAL: Obese gentleman, no acute distress, mild psychomotor retardation HEAD: Normocephalic, atraumatic.  EYES: Pupils equal, round, reactive to light.  No scleral icterus.  MOUTH: Nose/mouth/throat not examined due to masking requirements for COVID 19. NECK: Supple. No thyromegaly. Trachea midline. No JVD.  No adenopathy. PULMONARY: Good air entry bilaterally.  No adventitious sounds. CARDIOVASCULAR: S1 and S2. Regular rate and rhythm.  Grade 2/6 systolic ejection murmur left sternal border. ABDOMEN: Obese, otherwise benign. MUSCULOSKELETAL: No joint deformity, no clubbing, no edema.  NEUROLOGIC: No focal deficit, no gait disturbance, speech is fluent. SKIN: Intact,warm,dry.  On limited exam no rashes PSYCH: Flat affect, mild psychomotor retardation.  Ambulatory oxymetry was performed today:  At rest  on room air oxygen saturation was 99%, the patient ambulated at a moderate pace, completed 3 laps, O2 nadir 98%, moderate shortness of breath.  Resting heart rate was 60 bpm at maximum for this exercise 93 bpm.  No evidence of desaturation with exercise.   Assessment & Plan:     ICD-10-CM   1. Postviral fatigue syndrome  G93.31 CBC with Differential/Platelet    Comp Met (CMET)    TSH    T4, free    2. Shortness of breath  R06.02 DG Chest 2 View    CBC with Differential/Platelet    Comp Met (CMET)    TSH    T4, free    3. OSA on CPAP  G47.33     4. Psoriatic arthritis (HCC)  L40.50     5. Gout without tophus  M10.9       Orders Placed This Encounter  Procedures   DG Chest 2 View    Standing Status:   Future    Number of Occurrences:   1    Expiration Date:   10/21/2024    Reason for Exam (SYMPTOM  OR DIAGNOSIS REQUIRED):   Dyspnea    Preferred imaging location?:   [facility]   CBC with Differential/Platelet    Standing Status:   Future    Number  of Occurrences:   1    Expected Date:   10/22/2023    Expiration Date:   10/21/2024   Comp Met (CMET)    Standing Status:   Future    Number of Occurrences:   1    Expiration Date:   10/21/2024   TSH    Standing Status:   Future    Number of Occurrences:   1    Expiration Date:   10/21/2024   T4, free    Standing Status:   Future    Number of Occurrences:   1    Expiration Date:   10/21/2024   Discussion:    Post-viral fatigue syndrome Persistent fatigue and dyspnea following RSV infection in January, with symptoms of exhaustion, lack of energy, and lightheadedness post-activity. Consistent with post-viral fatigue syndrome, which can persist for months post-infection. Gradual increase in activity, hydration, and a clean diet are recommended for recovery. - Encourage gradual increase in activity levels - Advise maintaining hydration - Recommend a clean diet, avoiding fried and fatty foods  Obstructive sleep apnea Obstructive sleep apnea managed with CPAP. Difficulty using CPAP during RSV infection, but usage has resumed with good compliance. Excessive daytime somnolence suggests need for further evaluation. An auto-adjusting CPAP may be beneficial due to variable sleep patterns. 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  Gout and arthritis Gout and arthritis with recent missed rheumatology appointment. Reports knuckle pain attributed to gout, potentially exacerbating fatigue and other symptoms. - Advise scheduling an appointment with rheumatologist  Follow-up Follow-up required to monitor progress and adjust treatment. Blood work and  imaging needed to evaluate current health status and rule out complications. - Order chest CT to assess for lung scarring - Order blood work including thyroid function tests - Schedule follow-up appointment in 4-6 weeks      Advised if symptoms do not improve or worsen,  to please contact office for sooner follow up or seek emergency care.    I spent 40 minutes of dedicated to the care of this patient on the date of this encounter to include pre-visit review of records, face-to-face time with the patient discussing conditions above, post visit ordering of testing, clinical documentation with the electronic health record, making appropriate referrals as documented, and communicating necessary findings to members of the patients care team.     C. Danice Goltz, MD Advanced Bronch[facility]    *This note was generated using voice recognition software/Dragon and/or AI transcription program.  Despite best efforts to proofread, errors can occur which can change the meaning. Any transcriptional errors that result from this process are unintentional and may not be fully corrected at the time of dictation.

## 2023-10-22 NOTE — Patient Instructions (Signed)
 VISIT SUMMARY:  Randy Schneider, a 58 year old male, visited today due to ongoing fatigue and breathing difficulties following an RSV infection in January. He has a history of sleep apnea, gout, and arthritis. His symptoms include significant fatigue, dyspnea, and excessive daytime sleepiness. 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  YOUR PLAN:  -POST-VIRAL FATIGUE SYNDROME: Post-viral fatigue syndrome is a condition where fatigue and other symptoms persist for months after a viral infection. To aid recovery, gradually increase your activity levels, stay hydrated, and maintain a clean diet by avoiding fried and fatty foods.  -OBSTRUCTIVE SLEEP APNEA: Obstructive sleep apnea is a condition where breathing repeatedly stops and starts during sleep. You are managing this with a CPAP machine, but due to excessive daytime sleepiness, further evaluation by a sleep specialist is recommended. We will monitor your CPAP usage and consider an auto-adjusting CPAP if needed. Medication for daytime sleepiness may also be considered.  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 This exposure can have long-term effects on respiratory health.  -GOUT AND ARTHRITIS: Gout and arthritis are conditions that cause joint pain and inflammation. You missed your last rheumatology appointment, so it is important to reschedule it to manage your symptoms effectively.  INSTRUCTIONS:  Please schedule a follow-up appointment in 4-6 weeks. We will also need to conduct a chest CT to check for lung scarring and perform blood work, including thyroid function tests, to evaluate your current health status.

## 2023-11-01 ENCOUNTER — Encounter: Payer: Self-pay | Admitting: Pulmonary Disease

## 2023-11-08 NOTE — Progress Notes (Unsigned)
 Cardiology Office Note    Date:  11/09/2023   ID:  Randy Schneider, DOB Apr 12, 1966, MRN [id number]  PCP:  Lonie Peak, PA-C  Cardiologist:  Julien Nordmann, MD  Electrophysiologist:  None   Chief Complaint: Follow up  History of Present Illness:   Randy Schneider is a 58 y.o. male with history of normal coronary arteries by LHC in 2015 with chronic chest pain, PAF on flecainide with self discontinuing metoprolol secondary to intolerance with urinary retention and migraine not on anticoagulation, aortic atherosclerosis, HLD, chronic neck pain, depression, chronic fatigue, migraines, and OSA on CPAP and nocturnal supplemental oxygen at 3 L who presents for follow-up of chest pain.  LHC in 10/2013 showed normal coronary arteries.  Postprocedure he was noted to have a migraine and went into A-fib with RVR and was initiated on flecainide.  Has not been maintained on anticoagulation.  Most recent ischemic evaluation in 2021 via Lexiscan MPI that showed no significant ischemia with an EF of 40% felt to be depressed secondary to GI uptake artifact, and was overall low risk.  CT attenuated corrected images showed mild aortic atherosclerosis in the arch with no significant coronary artery calcification.  Echo at that time showed an EF of 55 to 60%, no regional wall motion abnormalities, mild LVH, grade 2 diastolic dysfunction, normal RV systolic function, ventricular cavity size, and RVSP, mildly dilated left atrium, mild to moderate mitral regurgitation, mildly dilated ascending aorta measuring 36 mm, and an estimated right atrial pressure of 3 mmHg.  Zio patch at that time showed a predominant rhythm of sinus with 1 run of SVT lasting 8 beats with rare atrial and ventricular ectopy.  Most recent echo from 01/2021 showed an EF of 55 to 60%, no regional wall motion abnormalities, grade 2 diastolic dysfunction, normal RV systolic function, ventricular cavity size, and RVSP, mildly dilated left atrium, and mild  mitral regurgitation.  He was last seen in the office in 09/2022 and reported chronic shortness of breath with exertion and lightheadedness, indicating the symptoms led to him avoiding work greater than 10 years prior.  He was taking flecainide 3-4 times per week for tachypalpitations and had self discontinued metoprolol secondary to migraines/urinary retention.  Remained off anticoagulation.  He has not been maintained on statin secondary to myalgias.  He comes in today noting an episode of of chest tightness that occurred in the late 09/2023 and lasted for 1 to 2 minutes and spontaneously resolved.  Chest discomfort occurred while at rest.  He has been without chest tightness since.  However, he has noted an increase in tachypalpitations burden since this episode with some exertional shortness of breath.  He reports his heart rate will trend into the 130s to 140s when walking up an incline in his yard.  There is some associated shortness of breath with this.  No significant lower extremity swelling or orthopnea (sleeps on a flat bed without a pillow without significant dyspnea).  Currently taking flecainide approximately 5 days/week.  Remains off anticoagulation.  Reports pulse ox with exertion at home in the upper 90s on room air.  No presyncope or syncope.   Labs independently reviewed: 10/2023 - Hgb 14.6, PLT 182, potassium 4.3, BUN 19, serum creatinine 1.26, albumin 4.2, AST/ALT normal, TSH normal 01/2023 - magnesium 2.1 11/2020 - TC 196, TG 314, HDL 32, LDL 110  Past Medical History:  Diagnosis Date   A-fib (HCC)    Acute gastritis with hemorrhage    Allergy  Cardiomegaly    Cervical spondylosis without myelopathy    CHF (congestive heart failure) (HCC)    Chronic kidney disease    stage 3    Depression    Gout    Hiatal hernia    Hypertension    Impotence of organic origin    Leaky heart valve    Lumbago    Migraine without aura, without mention of intractable migraine without  mention of status migrainosus    Oxygen deficiency    on 3 Liters 02 with CPAP only    Pure hyperglyceridemia    Renal insufficiency    Sleep apnea    wears cpap with 3 L 02 at night     Past Surgical History:  Procedure Laterality Date   BACK SURGERY  1999   CARPAL TUNNEL RELEASE Right 1994   COLONOSCOPY     age 57 for bleeding - normal    NASAL SINUS SURGERY  2012   ROTATOR CUFF REPAIR Left 2003   UPPER GASTROINTESTINAL ENDOSCOPY     age 81- for bleeding     Current Medications: Current Meds  Medication Sig   allopurinol (ZYLOPRIM) 100 MG tablet Take 100 mg by mouth daily.    allopurinol (ZYLOPRIM) 300 MG tablet Take 300 mg by mouth daily.    aspirin EC 81 MG tablet Take 81 mg by mouth daily.   carvedilol (COREG) 3.125 MG tablet Take 1 tablet (3.125 mg total) by mouth 2 (two) times daily.   lisinopril-hydrochlorothiazide (ZESTORETIC) 10-12.5 MG tablet TAKE 1 TABLET BY MOUTH EVERY DAY   metoprolol tartrate (LOPRESSOR) 50 MG tablet TAKE 1 TABLET 2 HR PRIOR TO CARDIAC PROCEDURE   NON FORMULARY CPAP DAILY   NON FORMULARY Oxygen 3 Liters bled into CPAP    Allergies:   Shellfish allergy, Codeine, and Sulfa antibiotics   Social History   Socioeconomic History   Marital status: Married    Spouse name: Not on file   Number of children: Not on file   Years of education: Not on file   Highest education level: Not on file  Occupational History   Not on file  Tobacco Use   Smoking status: Never   Smokeless tobacco: Never  Vaping Use   Vaping status: Never Used  Substance and Sexual Activity   Alcohol use: Yes    Comment: very rare    Drug use: Not Currently    Types: Marijuana    Comment: 30 yrs   Sexual activity: Not on file  Other Topics Concern   Not on file  Social History Narrative   Not on file   Social Drivers of Health   Financial Resource Strain: Not on file  Food Insecurity: Not on file  Transportation Needs: Not on file  Physical Activity: Not on  file  Stress: Not on file  Social Connections: Not on file     Family History:  The patient's family history includes Arrhythmia in his brother and sister; Heart attack in his father and mother; Heart disease in his brother; Heart failure in his father; Leukemia in his mother. There is no history of Colon polyps, Colon cancer, Esophageal cancer, Stomach cancer, or Rectal cancer.  ROS:   12-point review of systems is negative unless otherwise noted in the HPI.   EKGs/Labs/Other Studies Reviewed:    Studies reviewed were summarized above. The additional studies were reviewed today:  2D echo 01/24/2021: 1. Left ventricular ejection fraction, by estimation, is 55 to 60%. The  left  ventricle has normal function. The left ventricle has no regional  wall motion abnormalities. Left ventricular diastolic parameters are  consistent with Grade II diastolic  dysfunction (pseudonormalization). The average left ventricular global  longitudinal strain is -16.0 %. The global longitudinal strain is normal.   2. Right ventricular systolic function is normal. The right ventricular  size is normal. There is normal pulmonary artery systolic pressure. The  estimated right ventricular systolic pressure is 29.4 mmHg.   3. Left atrial size was mildly dilated.   4. The mitral valve is normal in structure. Mild mitral valve  regurgitation.  ___________  2D echo 11/28/2019: 1. Left ventricular ejection fraction, by estimation, is 55 to 60%. The  left ventricle has normal function. The left ventricle has no regional  wall motion abnormalities. There is mild left ventricular hypertrophy.  Left ventricular diastolic parameters  are consistent with Grade II diastolic dysfunction (pseudonormalization).   2. Right ventricular systolic function is normal. The right ventricular  size is normal. There is normal pulmonary artery systolic pressure.   3. Left atrial size was mildly dilated.   4. The mitral valve is  grossly normal. Mild to moderate mitral valve  regurgitation. No evidence of mitral stenosis.   5. The aortic valve was not well visualized. Aortic valve regurgitation  is not visualized. No aortic stenosis is present.   6. There is mild dilatation of the ascending aorta measuring 36 mm.   7. The inferior vena cava is normal in size with greater than 50%  respiratory variability, suggesting right atrial pressure of 3 mmHg.  __________  Luci Bank patch 11/2019: Normal sinus rhythm avg HR of 84 bpm.    1 run of Supraventricular Tachycardia occurred lasting 8 beats with a max rate of 190 bpm (avg 180 bpm).  Isolated SVEs were rare (<1.0%), SVE Couplets were rare (<1.0%), and no SVE Triplets were present.  Isolated VEs were rare (<1.0%), and no VE Couplets or VE Triplets were present. Ventricular Trigeminy was present.   Patient triggered events were not associated with significant arrhythmia __________  Eugenie Birks MPI 11/16/2019: Pharmacological myocardial perfusion imaging study with no significant  ischemia Normal wall motion, EF estimated at 40% (EF possibly depressed secondary to GI uptake artifact) No EKG changes concerning for ischemia at peak stress or in recovery. CT attenuation correction images with mild aortic atherosclerosis in the arch, no significant coronary calcification Low risk scan __________  2D echo 09/06/2015: - Left ventricle: The cavity size was normal. Systolic function was    normal. The estimated ejection fraction was in the range of 60%    to 65%. Wall motion was normal; there were no regional wall    motion abnormalities. Doppler parameters are consistent with    abnormal left ventricular relaxation (grade 1 diastolic    dysfunction).  - Mitral valve: There was mild regurgitation.  - Left atrium: The atrium was normal in size.  - Right ventricle: Systolic function was normal.  - Pulmonary arteries: Systolic pressure was within the normal    range.   Impressions:    - Rhythm is normal sinus.  __________  See CV studies in Epic for more remote imaging    EKG:  EKG is ordered today.  The EKG ordered today demonstrates NSR, 62 bpm, no acute ST/T changes  Recent Labs: 10/22/2023: ALT 20; BUN 19; Creatinine, Ser 1.26; Hemoglobin 14.6; Platelets 182; Potassium 4.3; Sodium 140; TSH 1.625  Recent Lipid Panel No results found for: "CHOL", "TRIG", "HDL", "CHOLHDL", "  VLDL", "LDLCALC", "LDLDIRECT"  PHYSICAL EXAM:    VS:  BP (!) 140/92 (BP Location: Left Arm, Patient Position: Sitting, Cuff Size: Large)   Pulse 62   Ht 5\' 9"  (1.753 m)   Wt 233 lb 9.6 oz (106 kg)   SpO2 95%   BMI 34.50 kg/m   BMI: Body mass index is 34.5 kg/m.  Physical Exam Vitals reviewed.  Constitutional:      Appearance: He is well-developed.  HENT:     Head: Normocephalic and atraumatic.  Eyes:     General:        Right eye: No discharge.        Left eye: No discharge.  Cardiovascular:     Rate and Rhythm: Normal rate and regular rhythm.     Heart sounds: Normal heart sounds, S1 normal and S2 normal. Heart sounds not distant. No midsystolic click and no opening snap. No murmur heard.    No friction rub.  Pulmonary:     Effort: Pulmonary effort is normal. No respiratory distress.     Breath sounds: Normal breath sounds. No decreased breath sounds, wheezing, rhonchi or rales.  Chest:     Chest wall: No tenderness.  Musculoskeletal:     Cervical back: Normal range of motion.     Right lower leg: No edema.     Left lower leg: No edema.  Skin:    General: Skin is warm and dry.     Nails: There is no clubbing.  Neurological:     Mental Status: He is alert and oriented to person, place, and time.  Psychiatric:        Speech: Speech normal.        Behavior: Behavior normal.        Thought Content: Thought content normal.        Judgment: Judgment normal.     Wt Readings from Last 3 Encounters:  11/09/23 233 lb 9.6 oz (106 kg)  10/22/23 235 lb 3.2 oz (106.7 kg)   09/07/23 238 lb 6.4 oz (108.1 kg)     ASSESSMENT & PLAN:   Precordial pain: Currently without symptoms of angina or cardiac decompensation.  Long history of chronic chest pain.  LHC in 2015 with normal coronary arteries.  Lexiscan MPI in 2021 without evidence of ischemia or scar with no significant coronary artery calcification and overall low risk.  Schedule coronary CTA.  Remains on aspirin.  Not on statin secondary to myalgias.  Chronic dyspnea: Coronary CTA to evaluate for ischemic heart disease as outlined above.  Obtain echo.  If workup is reassuring could consider CPX, though patient has significant knee and hip pain from arthritis that may limit his ability to exercise.  Palpitations with history of PAF with high risk medication use: Has historically been maintained on flecainide and without anticoagulation.  Has self discontinued metoprolol.  Risks of flecainide without AV nodal blocking medication discussed in detail up to and including death.  Recommend that he start carvedilol 3.125 mg twice daily.  For now remains on flecainide as directed by primary cardiologist.  However, if he is found to have significant ischemic or structural heart disease on the above evaluation flecainide will need to be discontinued.  Place Zio patch.  CHA2DS2-VASc at least 2 (HTN, vascular disease), will need to revisit anticoagulation status in follow-up, particularly if Zio patch shows breakthrough episodes of A-fib.  HTN: Blood pressure is mildly elevated in the office today.  Start carvedilol as outlined above.  He  otherwise remains on lisinopril/HCTZ.  OSA: On CPAP with supplemental oxygen at 3 L.  Followed by pulmonology.  Aortic atherosclerosis/HLD: Most recent LDL of 110.  Target LDL less than 70.  Intolerant to statins.  Address in follow-up.   Disposition: F/u with Dr. Mariah Milling in 3 months.   Medication Adjustments/Labs and Tests Ordered: Current medicines are reviewed at length with the patient  today.  Concerns regarding medicines are outlined above. Medication changes, Labs and Tests ordered today are summarized above and listed in the Patient Instructions accessible in Encounters.   Signed, Eula Listen, PA-C 11/09/2023 12:53 PM     [facility] [place] [address] [place], [place] [address] [phone]

## 2023-11-09 ENCOUNTER — Ambulatory Visit: Attending: Physician Assistant | Admitting: Physician Assistant

## 2023-11-09 ENCOUNTER — Telehealth: Payer: Self-pay

## 2023-11-09 ENCOUNTER — Encounter: Payer: Self-pay | Admitting: Physician Assistant

## 2023-11-09 ENCOUNTER — Ambulatory Visit

## 2023-11-09 VITALS — BP 140/92 | HR 62 | Ht 69.0 in | Wt 233.6 lb

## 2023-11-09 DIAGNOSIS — I480 Paroxysmal atrial fibrillation: Secondary | ICD-10-CM | POA: Diagnosis not present

## 2023-11-09 DIAGNOSIS — R0609 Other forms of dyspnea: Secondary | ICD-10-CM

## 2023-11-09 DIAGNOSIS — R002 Palpitations: Secondary | ICD-10-CM

## 2023-11-09 DIAGNOSIS — I10 Essential (primary) hypertension: Secondary | ICD-10-CM | POA: Diagnosis not present

## 2023-11-09 DIAGNOSIS — R072 Precordial pain: Secondary | ICD-10-CM | POA: Diagnosis not present

## 2023-11-09 DIAGNOSIS — G4733 Obstructive sleep apnea (adult) (pediatric): Secondary | ICD-10-CM

## 2023-11-09 DIAGNOSIS — Z79899 Other long term (current) drug therapy: Secondary | ICD-10-CM

## 2023-11-09 MED ORDER — CARVEDILOL 3.125 MG PO TABS: 3.1250 mg | ORAL_TABLET | Freq: Two times a day (BID) | ORAL | 3 refills | Status: DC

## 2023-11-09 MED ORDER — METOPROLOL TARTRATE 50 MG PO TABS: ORAL_TABLET | ORAL | 0 refills | Status: AC

## 2023-11-09 NOTE — Telephone Encounter (Signed)
 His studies have been nonrevealing up to now.  He is undergoing evaluation by cardiology which I think is important.  He does have sleep apnea and has been having some issues with this.  He was being followed by Dr. Craige Cotta (sleep MD) previously and may benefit with follow-up by our new sleep physician Dr. Betti Cruz.  If he wants to postpone his appointment with me that is fine.

## 2023-11-09 NOTE — Patient Instructions (Addendum)
 Medication Instructions:  Your physician recommends the following medication changes.  TAKE: Metoprolol tartrate 50 mg once (about 2 hours prior to your CTA)  START TAKING: Coreg 3.125 mg twice daily  *If you need a refill on your cardiac medications before your next appointment, please call your pharmacy*  Lab Work: Your provider would like for you to have following labs drawn today BMeT.   If you have labs (blood work) drawn today and your tests are completely normal, you will receive your results only by: MyChart Message (if you have MyChart) OR A paper copy in the mail If you have any lab test that is abnormal or we need to change your treatment, we will call you to review the results.  Testing/Procedures: Your physician has requested that you have an echocardiogram. Echocardiography is a painless test that uses sound waves to create images of your heart. It provides your doctor with information about the size and shape of your heart and how well your heart's chambers and valves are working.   You may receive an ultrasound enhancing agent through an IV if needed to better visualize your heart during the echo. This procedure takes approximately one hour.  There are no restrictions for this procedure.  This will take place at [address] [facility]) [address], [place] [address]  Please note: We ask at that you not bring children with you during ultrasound (echo/ vascular) testing. Due to room size and safety concerns, children are not allowed in the ultrasound rooms during exams. Our front office staff cannot provide observation of children in our lobby area while testing is being conducted. An adult accompanying a patient to their appointment will only be allowed in the ultrasound room at the discretion of the ultrasound technician under special circumstances. We apologize for any inconvenience.  Your cardiac CT will be scheduled at the below location:   [facility] [address] [place], [place] [address] [phone]  If scheduled at [facility] or [facility], please arrive 15 mins early for check-in and test prep.  Please follow these instructions carefully (unless otherwise directed):  An IV will be required for this test and Nitroglycerin will be given.  Hold all erectile dysfunction medications at least 3 days (72 hrs) prior to test. (Ie viagra, cialis, sildenafil, tadalafil, etc)   On the Night Before the Test: Be sure to Drink plenty of water. Do not consume any caffeinated/decaffeinated beverages or chocolate 12 hours prior to your test. Do not take any antihistamines 12 hours prior to your test.  On the Day of the Test: Drink plenty of water until 1 hour prior to the test. Do not eat any food 1 hour prior to test. You may take your regular medications prior to the test.  Take metoprolol (Lopressor) two hours prior to test. If you take Furosemide/Hydrochlorothiazide/Spironolactone/Chlorthalidone, please HOLD on the morning of the test. Patients who wear a continuous glucose monitor MUST remove the device prior to scanning.  After the Test: Drink plenty of water. After receiving IV contrast, you may experience a mild flushed feeling. This is normal. On occasion, you may experience a mild rash up to 24 hours after the test. This is not dangerous. If this occurs, you can take Benadryl 25 mg, Zyrtec, Claritin, or Allegra and increase your fluid intake. (Patients taking Tikosyn should avoid Benadryl, and may take Zyrtec, Claritin, or Allegra) If you experience trouble breathing, this can  be serious. If it is severe call 911 IMMEDIATELY. If it is mild, please call our office.  We will call to schedule your test 2-4 weeks out understanding that some insurance companies will need an authorization prior to the service being performed.   For more  information and frequently asked questions, please visit our website : [email or web address]  For non-scheduling related questions, please contact the cardiac imaging nurse navigator should you have any questions/concerns: Cardiac Imaging Nurse Navigators Direct Office Dial: [phone]   For scheduling needs, including cancellations and rescheduling, please call [place], [phone].  ZIO XT- Long Term Monitor Instructions  Your physician has requested you wear a ZIO patch monitor for 14 days.  This is a single patch monitor. [employer] supplies one patch monitor per enrollment. Additional stickers are not available. Please do not apply patch if you will be having a Nuclear Stress Test, Echocardiogram, Cardiac CT, MRI, or Chest Xray during the period you would be wearing the monitor. The patch cannot be worn during these tests. You cannot remove and re-apply the ZIO XT patch monitor.  Your ZIO patch monitor will be mailed 3 day USPS to your address on file. It may take 3-5 days to receive your monitor after you have been enrolled.  Once you have received your monitor, please review the enclosed instructions. Your monitor has already been registered assigning a specific monitor serial # to you.  Billing and Patient Assistance Program Information  We have supplied [employer] with any of your insurance information on file for billing purposes. [employer] offers a sliding scale Patient Assistance Program for patients that do not have insurance, or whose insurance does not completely cover the cost of the ZIO monitor.  You must apply for the Patient Assistance Program to qualify for this discounted rate.  To apply, please call [employer] at [phone], select option 4, select option 2, ask to apply for Patient Assistance Program. Meredeth Ide will ask your household income, and how many people are in your household. They will quote your out-of-pocket cost based on that information.  [employer] will  also be able to set up a 60-month, interest-free payment plan if needed.  Applying the monitor   Shave hair from upper left chest.  Hold abrader disc by orange tab. Rub abrader in 40 strokes over the upper left chest as indicated in your monitor instructions.  Clean area with 4 enclosed alcohol pads. Let dry.  Apply patch as indicated in monitor instructions. Patch will be placed under collarbone on left side of chest with arrow pointing upward.  Rub patch adhesive wings for 2 minutes. Remove white label marked "1". Remove the white label marked "2". Rub patch adhesive wings for 2 additional minutes.  While looking in a mirror, press and release button in center of patch. A small green light will flash 3-4 times. This will be your only indicator that the monitor has been turned on.  Do not shower for the first 24 hours. You may shower after the first 24 hours.  Press the button if you feel a symptom. You will hear a small click. Record Date, Time and Symptom in the Patient Logbook.  When you are ready to remove the patch, follow instructions on the last 2 pages of Patient Logbook. Stick patch monitor onto the last page of Patient Logbook.  Place Patient Logbook in the blue and white box. Use locking tab on box and tape box closed securely. The blue and white box has prepaid postage on it. Please  place it in the mailbox as soon as possible. Your physician should have your test results approximately 7 days after the monitor has been mailed back to [facility].  Call [facility] Customer Care at [phone] if you have questions regarding your ZIO XT patch monitor. Call them immediately if you see an orange light blinking on your monitor.  If your monitor falls off in less than 4 days, contact our Monitor department at [phone].  If your monitor becomes loose or falls off after 4 days call [employer] at [phone] for suggestions on securing your monitor.   Follow-Up: At [facility], you and your health needs are our priority.  As part of our continuing mission to provide you with exceptional heart care, our providers are all part of one team.  This team includes your primary Cardiologist (physician) and Advanced Practice Providers or APPs (Physician Assistants and Nurse Practitioners) who all work together to provide you with the care you need, when you need it.  Your next appointment:   3 month(s)  Provider:   Julien Nordmann, MD

## 2023-11-09 NOTE — Telephone Encounter (Signed)
 Copied from CRM [id number]. Topic: General - Other >> Nov 09, 2023 11:59 AM Konrad Dolores wrote: Reason for CRM: Harriett Sine called in (spouse) regarding patient Randy Schneider's appointment and cancelled it. She stated Kaide had an appointment on 10/22/2023, had blood work done, and had imaging done as well. She believes she already received information on the lab work and xray and doesn't believe Marqueze needed the appointment in May. Harriett Sine requests a call back if Trayshawn needs to have an appointment so soon. Phone number [phone].

## 2023-11-11 NOTE — Telephone Encounter (Signed)
 Harriett Sine (wife) advised as below. She will have patient call to follow up on OSA. Nothing further needed.

## 2023-11-26 ENCOUNTER — Ambulatory Visit: Payer: 59 | Admitting: Cardiovascular Disease

## 2023-12-06 ENCOUNTER — Ambulatory Visit

## 2023-12-06 DIAGNOSIS — R002 Palpitations: Secondary | ICD-10-CM | POA: Diagnosis not present

## 2023-12-09 ENCOUNTER — Telehealth: Payer: Self-pay | Admitting: Cardiovascular Disease

## 2023-12-09 NOTE — Telephone Encounter (Signed)
 Wife Haskell Linker) called to follow-up on patients heart monitor results.

## 2023-12-09 NOTE — Telephone Encounter (Signed)
 Called patient wife (okay per DPR) advised of monitor results.   Patient wife aware to await ECHO and CCTA.   Patient wife verbalized understanding.

## 2023-12-10 ENCOUNTER — Ambulatory Visit: Admitting: Pulmonary Disease

## 2023-12-10 ENCOUNTER — Ambulatory Visit: Attending: Physician Assistant

## 2023-12-10 DIAGNOSIS — R0609 Other forms of dyspnea: Secondary | ICD-10-CM

## 2023-12-10 LAB — ECHOCARDIOGRAM COMPLETE
AR max vel: 2.58 cm2
AV Area VTI: 2.7 cm2
AV Area mean vel: 2.59 cm2
AV Mean grad: 4 mmHg
AV Peak grad: 7.1 mmHg
Ao pk vel: 1.33 m/s
Area-P 1/2: 3.65 cm2
Calc EF: 55.2 %
S' Lateral: 2.57 cm
Single Plane A2C EF: 55.3 %
Single Plane A4C EF: 52.9 %

## 2023-12-22 ENCOUNTER — Telehealth (HOSPITAL_COMMUNITY): Payer: Self-pay | Admitting: *Deleted

## 2023-12-22 ENCOUNTER — Encounter (HOSPITAL_COMMUNITY): Payer: Self-pay

## 2023-12-22 NOTE — Telephone Encounter (Signed)
 Reaching out to patient to offer assistance regarding upcoming cardiac imaging study; pt's wife verbalizes understanding of appt date/time, parking situation and where to check in, pre-test NPO status and medications ordered, and verified current allergies; name and call back number provided for further questions should they arise  Chase Copping RN Navigator Cardiac Imaging [facility] [phone] office [phone] cell  Patient to take 50mg  metoprolol  tartrate two hours prior to his cardiac CT scan.

## 2023-12-23 ENCOUNTER — Ambulatory Visit: Payer: Self-pay | Admitting: Physician Assistant

## 2023-12-23 ENCOUNTER — Ambulatory Visit
Admission: RE | Admit: 2023-12-23 | Discharge: 2023-12-23 | Disposition: A | Discharge status: Home / Self Care | Source: Ambulatory Visit | Attending: Physician Assistant | Admitting: Physician Assistant

## 2023-12-23 DIAGNOSIS — R072 Precordial pain: Secondary | ICD-10-CM | POA: Diagnosis present

## 2023-12-23 MED ORDER — IOHEXOL 350 MG/ML SOLN
80.0000 mL | Freq: Once | INTRAVENOUS | Status: AC | PRN
  Administered 2023-12-23: 80 mL via INTRAVENOUS

## 2023-12-23 MED ORDER — NITROGLYCERIN 0.4 MG SL SUBL
0.8000 mg | SUBLINGUAL_TABLET | Freq: Once | SUBLINGUAL | Status: AC
  Administered 2023-12-23: 0.4 mg via SUBLINGUAL
  Filled 2023-12-23: qty 25

## 2023-12-23 NOTE — Progress Notes (Signed)
Patient tolerated procedure well. W/C to lobby.  Ambulate w/o difficulty. Denies light headedness or being dizzy. Encouraged to drink extra water today and reasoning explained. Verbalized understanding. All questions answered. ABC intact. No further needs. Discharge from procedure area w/o issues.

## 2024-02-11 ENCOUNTER — Ambulatory Visit: Admitting: Cardiovascular Disease

## 2024-07-06 ENCOUNTER — Ambulatory Visit: Admitting: Dermatology

## 2024-07-06 ENCOUNTER — Encounter: Payer: Self-pay | Admitting: Dermatology

## 2024-07-06 DIAGNOSIS — D492 Neoplasm of unspecified behavior of bone, soft tissue, and skin: Secondary | ICD-10-CM

## 2024-07-06 DIAGNOSIS — D369 Benign neoplasm, unspecified site: Secondary | ICD-10-CM

## 2024-07-06 DIAGNOSIS — D229 Melanocytic nevi, unspecified: Secondary | ICD-10-CM

## 2024-07-06 DIAGNOSIS — L821 Other seborrheic keratosis: Secondary | ICD-10-CM | POA: Diagnosis not present

## 2024-07-06 DIAGNOSIS — D3611 Benign neoplasm of peripheral nerves and autonomic nervous system of face, head, and neck: Secondary | ICD-10-CM

## 2024-07-06 DIAGNOSIS — D2239 Melanocytic nevi of other parts of face: Secondary | ICD-10-CM | POA: Diagnosis not present

## 2024-07-06 DIAGNOSIS — D2339 Other benign neoplasm of skin of other parts of face: Secondary | ICD-10-CM | POA: Diagnosis not present

## 2024-07-06 NOTE — Progress Notes (Signed)
   New Patient Visit   Subjective  Randy Schneider is a 58 y.o. male who presents for the following: check spot L cheek ~1.5 yrs, with some smaller ones coming up, sore, growing, check spot on nose, no hx of skin cancer no fhx of skin cancer  Patient accompanied by wife who contributes to history.   The following portions of the chart were reviewed this encounter and updated as appropriate: medications, allergies, medical history  Review of Systems:  No other skin or systemic complaints except as noted in HPI or Assessment and Plan.  Objective  Well appearing patient in no apparent distress; mood and affect are within normal limits.   A focused examination was performed of the following areas: face  Relevant exam findings are noted in the Assessment and Plan.  L cheek nasolabial fold 7 mm skin coloured papule   Assessment & Plan   ANGIOFIBROMA R Nasal tip/dorsum Exam: firm skin colored papule  Treatment Plan: Benign-appearing.  Observation.  Call clinic for new or changing lesions.  Recommend daily use of broad spectrum spf 30+ sunscreen to sun-exposed areas.    MELANOCYTIC NEVI L cheek Exam: Tan-brown and/or pink-flesh-colored symmetric macules and papules  Treatment Plan: Benign appearing on exam today. Recommend observation. Call clinic for new or changing moles. Recommend daily use of broad spectrum spf 30+ sunscreen to sun-exposed areas.   NEOPLASM OF SKIN L cheek nasolabial fold Epidermal / dermal shaving  Lesion diameter (cm):  0.7 Informed consent: discussed and consent obtained   Timeout: patient name, date of birth, surgical site, and procedure verified   Procedure prep:  Patient was prepped and draped in usual sterile fashion Prep type:  Isopropyl alcohol Anesthesia: the lesion was anesthetized in a standard fashion   Anesthetic:  1% lidocaine w/ epinephrine 1-100,000 buffered w/ 8.4% NaHCO3 Instrument used: DermaBlade   Hemostasis achieved with:  pressure and aluminum chloride   Outcome: patient tolerated procedure well   Post-procedure details: wound care instructions given    Specimen 1 - Surgical pathology Differential Diagnosis: Dermal nevus vs other  Check Margins: No 7 mm skin coloured papule 2 pieces MULTIPLE BENIGN NEVI   SEBORRHEIC KERATOSES   ANGIOFIBROMA    Return if symptoms worsen or fail to improve.  I, Grayce Saunas, RMA, am acting as scribe for Boneta Sharps, MD .   Documentation: I have reviewed the above documentation for accuracy and completeness, and I agree with the above.  Boneta Sharps, MD

## 2024-07-06 NOTE — Patient Instructions (Addendum)

## 2024-07-11 LAB — SURGICAL PATHOLOGY

## 2024-07-12 ENCOUNTER — Ambulatory Visit: Payer: Self-pay | Admitting: Dermatology

## 2024-07-29 ENCOUNTER — Other Ambulatory Visit: Payer: Self-pay | Admitting: Cardiovascular Disease

## 2024-08-04 ENCOUNTER — Telehealth: Payer: Self-pay | Admitting: Physician Assistant

## 2024-08-04 NOTE — Telephone Encounter (Signed)
" °*  STAT* If patient is at the pharmacy, call can be transferred to refill team.   1. Which medications need to be refilled? (please list name of each medication and dose if known)  flecainide  (TAMBOCOR ) 50 MG tablet   2. Would you like to learn more about the convenience, safety, & potential cost savings by using the [facility]? no   3. Are you open to using the [facility] (Type [facility]. no   4. Which pharmacy/location (including street and city if local pharmacy) is medication to be sent to?  [id number] - [place], [place] - [address] [facility] [address] [facility]     5. Do they need a 30 day or 90 day supply? 30 day  Appt sched for 1/9 at 1:55pm   "

## 2024-08-08 NOTE — Telephone Encounter (Signed)
 Returned call to pt.  Left a message for pt that if he still needed refill,  to call us  back.  Per chart, [facility] #[facility]

## 2024-08-09 NOTE — Progress Notes (Unsigned)
 "  Cardiology Clinic Note   Date: 08/09/2024 ID: DREDYN GUBBELS, DOB 1966/01/11, MRN [id number]  Primary Cardiologist:  Evalene Lunger, MD  Chief Complaint   ADLER CHARTRAND is a 59 y.o. male who presents to the clinic today for ***  Patient Profile   JAZMIN VENSEL is followed by Dr. Gollan for the history outlined below.      Past medical history significant for: Chest pain. LHC 10/05/2013: Normal coronary arteries. Nuclear stress test 11/16/2019: No significant ischemia.  Low risk scan. Coronary CTA 12/23/2023: Coronary calcium score of 0 with no evidence of CAD. PAF/PSVT. Onset 2015. 14-day ZIO 12/02/2023: HR 49 to 171 bpm, average 76 bpm.  3 runs of SVT fastest/longest 10.6 seconds max rate 171 bpm average 131 bpm.  Rare ectopy.  11 patient triggered events associated with NSR, rare PVC. Echo 12/10/2023: EF 55 to 60%.  No RWMA.  Normal diastolic parameters.  Normal RV size/function.  Mild MR.  Mild to moderate TR. Hypertension. Hypertriglyceridemia. OSA. Gout.  In summary, patient underwent LHC in March 2015 which demonstrated normal coronary arteries.  Postprocedure he noted a migraine and went into A-fib with RVR.  He was initiated on flecainide .  He has not been maintained on anticoagulation.  He had a low risk nuclear stress test in April 2021.  Echo at that time demonstrated EF 55 to 60%, no RWMA, mild LVH, grade 2 DD, normal RV size/function, normal PA pressure, mild LAE, mild to moderate MR, mildly dilated ascending aorta 36 mm.  Zio in 2021 showed 1 run of SVT lasting 8 beats with rare atrial and ventricle ectopy.  Echo in June 2022 demonstrated normal LV/RV function, no RWMA, Grade II DD, normal PA pressure, mild LAE, mild MR.  In February 2024 patient reported continued chronic dyspnea with exertion and lightheadedness.  He was taking flecainide  3-4 times per week for tachypalpitations and had self discontinued metoprolol  secondary to migraines and urinary retention.  He was not on  OAC and was intolerant to statins secondary to myalgias.  Patient was last seen in the office by Bernardino Bring, PA-C on 11/09/2023 for routine follow-up.  He noted in late February 2025 an episode of chest tightness at rest lasting 1 to 2 minutes and resolving spontaneously.  Since the episode of tightness he noted increased tachypalpitations with associated exertional dyspnea.  Heart rate will increase to 130-140s while walking up an incline at his home.  He denied significant lower extremity edema or orthopnea.  He reported taking flecainide  approximately 5 days a week.  Blood pressure was elevated at 140/92 and he was started on carvedilol .  Dangers of flecainide  without AV nodal blockade were discussed.  He underwent coronary CTA which demonstrated a calcium score of 0.  14-day ZIO showed 3 runs of SVT.  Echo with normal LV/RV function and mild MR.     History of Present Illness    Today, patient ***  Chest pain Normal coronary arteries seen on [facility] March 2015.  Coronary CTA May 2025 demonstrated calcium score of 0 with no evidence of CAD.  Patient*** - Continue to monitor.  Tachypalpitations/PAF/PSVT 14-day Zio May 2025 demonstrated HR 49 to 171 bpm, average 76 bpm, 3 runs of SVT fastest/longest 10.6 seconds max rate 171 bpm, average 131 bpm, rare ectopy.  Patient*** - Continue as needed flecainide  and daily carvedilol .  Hypertension BP today*** - Continue carvedilol .  ROS: All other systems reviewed and are otherwise negative except as noted in History of  Present Illness.  EKGs/Labs Reviewed        10/22/2023: ALT 20; AST 18; BUN 19; Creatinine, Ser 1.26; Potassium 4.3; Sodium 140   10/22/2023: Hemoglobin 14.6; WBC 4.8   10/22/2023: TSH 1.625   No results found for requested labs within last 365 days.  ***  Risk Assessment/Calculations    {Does this patient have ATRIAL FIBRILLATION?:[phone]} No BP recorded.  {Refresh Note OR Click here to enter BP  :1}***        Physical Exam     VS:  There were no vitals taken for this visit. , BMI There is no height or weight on file to calculate BMI.  GEN: Well nourished, well developed, in no acute distress. Neck: No JVD or carotid bruits. Cardiac: *** RRR. *** No murmur. No rubs or gallops.   Respiratory:  Respirations regular and unlabored. Clear to auscultation without rales, wheezing or rhonchi. GI: Soft, nontender, nondistended. Extremities: Radials/DP/PT 2+ and equal bilaterally. No clubbing or cyanosis. No edema ***  Skin: Warm and dry, no rash. Neuro: Strength intact.  Assessment & Plan   ***  Disposition: ***     {Are you ordering a CV Procedure (e.g. stress test, cath, DCCV, TEE, etc)?   Press F2        :[id number]}   Signed, Barnie HERO. Tanishia Lemaster, DNP, NP-C  "

## 2024-08-11 ENCOUNTER — Ambulatory Visit: Admitting: Student

## 2024-08-11 NOTE — Progress Notes (Unsigned)
 "  Cardiology Clinic Note   Date: 08/11/2024 ID: TAN CLOPPER, DOB 1965/08/11, MRN [id number]  Primary Cardiologist:  Evalene Lunger, MD  Chief Complaint   Randy Schneider is a 59 y.o. male who presents to the clinic today for ***  Patient Profile   Randy Schneider is followed by Dr. Gollan for the history outlined below.      Past medical history significant for: Chest pain. LHC 10/05/2013: Normal coronary arteries. Nuclear stress test 11/16/2019: No significant ischemia.  Low risk scan. Coronary CTA 12/23/2023: Coronary calcium score of 0 with no evidence of CAD. PAF/PSVT. Onset 2015. 14-day ZIO 12/02/2023: HR 49 to 171 bpm, average 76 bpm.  3 runs of SVT fastest/longest 10.6 seconds max rate 171 bpm average 131 bpm.  Rare ectopy.  11 patient triggered events associated with NSR, rare PVC. Echo 12/10/2023: EF 55 to 60%.  No RWMA.  Normal diastolic parameters.  Normal RV size/function.  Mild MR.  Mild to moderate TR. Hypertension. Hypertriglyceridemia. OSA. Gout.  In summary, patient underwent LHC in March 2015 which demonstrated normal coronary arteries.  Postprocedure he noted a migraine and went into A-fib with RVR.  He was initiated on flecainide .  He has not been maintained on anticoagulation.  He had a low risk nuclear stress test in April 2021.  Echo at that time demonstrated EF 55 to 60%, no RWMA, mild LVH, grade 2 DD, normal RV size/function, normal PA pressure, mild LAE, mild to moderate MR, mildly dilated ascending aorta 36 mm.  Zio in 2021 showed 1 run of SVT lasting 8 beats with rare atrial and ventricle ectopy.  Echo in June 2022 demonstrated normal LV/RV function, no RWMA, Grade II DD, normal PA pressure, mild LAE, mild MR.  In February 2024 patient reported continued chronic dyspnea with exertion and lightheadedness.  He was taking flecainide  3-4 times per week for tachypalpitations and had self discontinued metoprolol  secondary to migraines and urinary retention.  He was not on  OAC and was intolerant to statins secondary to myalgias.  Patient was last seen in the office by Bernardino Bring, PA-C on 11/09/2023 for routine follow-up.  He noted in late February 2025 an episode of chest tightness at rest lasting 1 to 2 minutes and resolving spontaneously.  Since the episode of tightness he noted increased tachypalpitations with associated exertional dyspnea.  Heart rate will increase to 130-140s while walking up an incline at his home.  He denied significant lower extremity edema or orthopnea.  He reported taking flecainide  approximately 5 days a week.  Blood pressure was elevated at 140/92 and he was started on carvedilol .  Dangers of flecainide  without AV nodal blockade were discussed.  He underwent coronary CTA which demonstrated a calcium score of 0.  14-day ZIO showed 3 runs of SVT.  Echo with normal LV/RV function and mild MR.     History of Present Illness    Today, patient ***  Chest pain Normal coronary arteries seen on [facility] March 2015.  Coronary CTA May 2025 demonstrated calcium score of 0 with no evidence of CAD.  Patient*** - Continue to monitor.  Tachypalpitations/PAF/PSVT 14-day Zio May 2025 demonstrated HR 49 to 171 bpm, average 76 bpm, 3 runs of SVT fastest/longest 10.6 seconds max rate 171 bpm, average 131 bpm, rare ectopy.  Patient*** - Continue as needed flecainide  and daily carvedilol .  Hypertension BP today*** - Continue carvedilol .  ROS: All other systems reviewed and are otherwise negative except as noted in History of  Present Illness.  EKGs/Labs Reviewed        10/22/2023: ALT 20; AST 18; BUN 19; Creatinine, Ser 1.26; Potassium 4.3; Sodium 140   10/22/2023: Hemoglobin 14.6; WBC 4.8   10/22/2023: TSH 1.625   No results found for requested labs within last 365 days.  ***  Risk Assessment/Calculations    {Does this patient have ATRIAL FIBRILLATION?[phone]} No BP recorded.  {Refresh Note OR Click here to enter BP  :1}***        Physical Exam     VS:  There were no vitals taken for this visit. , BMI There is no height or weight on file to calculate BMI.  GEN: Well nourished, well developed, in no acute distress. Neck: No JVD or carotid bruits. Cardiac: *** RRR. *** No murmur. No rubs or gallops.   Respiratory:  Respirations regular and unlabored. Clear to auscultation without rales, wheezing or rhonchi. GI: Soft, nontender, nondistended. Extremities: Radials/DP/PT 2+ and equal bilaterally. No clubbing or cyanosis. No edema ***  Skin: Warm and dry, no rash. Neuro: Strength intact.  Assessment & Plan   ***  Disposition: ***     {Are you ordering a CV Procedure (e.g. stress test, cath, DCCV, TEE, etc)?   Press F2        :[id number]}   Signed, Barnie HERO. Shanese Riemenschneider, DNP, NP-C  "

## 2024-08-15 NOTE — Telephone Encounter (Signed)
 Pts wife states she would like the medication to be sent to the pharmacy because [facility].

## 2024-08-16 MED ORDER — FLECAINIDE ACETATE 50 MG PO TABS: 50.0000 mg | ORAL_TABLET | Freq: Two times a day (BID) | ORAL | 0 refills | Status: DC

## 2024-08-16 NOTE — Telephone Encounter (Signed)
Refill sent again

## 2024-08-16 NOTE — Addendum Note (Signed)
 Addended by: MEMORY DELON POUR on: 08/16/2024 01:50 PM   Modules accepted: Orders

## 2024-08-18 ENCOUNTER — Ambulatory Visit: Admitting: Student

## 2024-08-18 ENCOUNTER — Encounter: Payer: Self-pay | Admitting: Student

## 2024-08-18 VITALS — BP 132/80 | HR 72 | Ht 69.0 in | Wt 238.0 lb

## 2024-08-18 DIAGNOSIS — I471 Supraventricular tachycardia, unspecified: Secondary | ICD-10-CM | POA: Diagnosis not present

## 2024-08-18 DIAGNOSIS — I480 Paroxysmal atrial fibrillation: Secondary | ICD-10-CM

## 2024-08-18 DIAGNOSIS — R072 Precordial pain: Secondary | ICD-10-CM

## 2024-08-18 DIAGNOSIS — R002 Palpitations: Secondary | ICD-10-CM | POA: Diagnosis not present

## 2024-08-18 DIAGNOSIS — I10 Essential (primary) hypertension: Secondary | ICD-10-CM | POA: Diagnosis not present

## 2024-08-18 MED ORDER — FLECAINIDE ACETATE 50 MG PO TABS: 50.0000 mg | ORAL_TABLET | Freq: Two times a day (BID) | ORAL | 3 refills | Status: DC

## 2024-08-18 MED ORDER — LISINOPRIL-HYDROCHLOROTHIAZIDE 10-12.5 MG PO TABS: 1.0000 | ORAL_TABLET | Freq: Every day | ORAL | 3 refills | Status: DC

## 2024-08-18 MED ORDER — NITROGLYCERIN 0.4 MG SL SUBL: 0.4000 mg | SUBLINGUAL_TABLET | SUBLINGUAL | 2 refills | Status: AC | PRN

## 2024-08-18 MED ORDER — NITROGLYCERIN 0.4 MG SL SUBL: 0.4000 mg | SUBLINGUAL_TABLET | SUBLINGUAL | 2 refills | Status: DC | PRN

## 2024-08-18 MED ORDER — FLECAINIDE ACETATE 50 MG PO TABS: 50.0000 mg | ORAL_TABLET | Freq: Two times a day (BID) | ORAL | 3 refills | Status: AC

## 2024-08-18 MED ORDER — CARVEDILOL 3.125 MG PO TABS: 3.1250 mg | ORAL_TABLET | Freq: Two times a day (BID) | ORAL | 3 refills | Status: AC

## 2024-08-18 MED ORDER — CARVEDILOL 3.125 MG PO TABS: 3.1250 mg | ORAL_TABLET | Freq: Two times a day (BID) | ORAL | 3 refills | Status: DC

## 2024-08-18 MED ORDER — LISINOPRIL-HYDROCHLOROTHIAZIDE 10-12.5 MG PO TABS: 1.0000 | ORAL_TABLET | Freq: Every day | ORAL | 3 refills | Status: AC

## 2024-08-18 NOTE — Patient Instructions (Signed)
 Medication Instructions:   Your physician recommends that you continue on your current medications as directed. Please refer to the Current Medication list given to you today.    *If you need a refill on your cardiac medications before your next appointment, please call your pharmacy*  Lab Work:  None ordered at this time   If you have labs (blood work) drawn today and your tests are completely normal, you will receive your results only by:  MyChart Message (if you have MyChart) OR  A paper copy in the mail If you have any lab test that is abnormal or we need to change your treatment, we will call you to review the results.  Testing/Procedures:  None ordered at this time   Referrals:  None ordered at this time   Follow-Up:  At [facility], you and your health needs are our priority.  As part of our continuing mission to provide you with exceptional heart care, our providers are all part of one team.  This team includes your primary Cardiologist (physician) and Advanced Practice Providers or APPs (Physician Assistants and Nurse Practitioners) who all work together to provide you with the care you need, when you need it.  Your next appointment:   1 year(s)  Provider:    Evalene Lunger, MD or Barnie Hila, NP    We recommend signing up for the patient portal called MyChart.  Sign up information is provided on this After Visit Summary.  MyChart is used to connect with patients for Virtual Visits (Telemedicine).  Patients are able to view lab/test results, encounter notes, upcoming appointments, etc.  Non-urgent messages can be sent to your provider as well.   To learn more about what you can do with MyChart, go to [email or web address].
